# Patient Record
Sex: Female | Born: 1980 | Race: Black or African American | Hispanic: No | Marital: Single | Smoking: Never smoker
Health system: Southern US, Community
[De-identification: ages and names within clinical notes are randomized; demographics above are authoritative.]

## PROBLEM LIST (undated history)

## (undated) ENCOUNTER — Inpatient Hospital Stay (HOSPITAL_COMMUNITY): Payer: Self-pay

---
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
## 2000-07-04 ENCOUNTER — Encounter: Payer: Self-pay | Admitting: Emergency Medicine

## 2000-07-04 ENCOUNTER — Emergency Department (HOSPITAL_COMMUNITY): Admission: EM | Admit: 2000-07-04 | Discharge: 2000-07-04 | Payer: Self-pay | Admitting: Emergency Medicine

## 2004-02-11 ENCOUNTER — Emergency Department (HOSPITAL_COMMUNITY): Admission: EM | Admit: 2004-02-11 | Discharge: 2004-02-11 | Payer: Self-pay | Admitting: Emergency Medicine

## 2004-02-13 ENCOUNTER — Emergency Department (HOSPITAL_COMMUNITY): Admission: EM | Admit: 2004-02-13 | Discharge: 2004-02-13 | Payer: Self-pay | Admitting: Emergency Medicine

## 2004-03-10 ENCOUNTER — Other Ambulatory Visit: Admission: RE | Admit: 2004-03-10 | Discharge: 2004-03-10 | Payer: Self-pay | Admitting: Obstetrics and Gynecology

## 2006-07-26 ENCOUNTER — Other Ambulatory Visit: Admission: RE | Admit: 2006-07-26 | Discharge: 2006-07-26 | Payer: Self-pay | Admitting: Obstetrics and Gynecology

## 2007-02-18 ENCOUNTER — Inpatient Hospital Stay (HOSPITAL_COMMUNITY): Admission: AD | Admit: 2007-02-18 | Discharge: 2007-02-18 | Payer: Self-pay | Admitting: Obstetrics

## 2007-02-21 ENCOUNTER — Ambulatory Visit (HOSPITAL_COMMUNITY): Admission: RE | Admit: 2007-02-21 | Discharge: 2007-02-21 | Payer: Self-pay | Admitting: Obstetrics

## 2007-07-14 ENCOUNTER — Inpatient Hospital Stay (HOSPITAL_COMMUNITY): Admission: AD | Admit: 2007-07-14 | Discharge: 2007-07-14 | Payer: Self-pay | Admitting: Obstetrics & Gynecology

## 2007-07-19 ENCOUNTER — Inpatient Hospital Stay (HOSPITAL_COMMUNITY): Admission: AD | Admit: 2007-07-19 | Discharge: 2007-07-23 | Payer: Self-pay | Admitting: Obstetrics

## 2007-07-20 ENCOUNTER — Encounter: Payer: Self-pay | Admitting: Obstetrics

## 2007-09-07 ENCOUNTER — Emergency Department (HOSPITAL_COMMUNITY): Admission: EM | Admit: 2007-09-07 | Discharge: 2007-09-07 | Payer: Self-pay | Admitting: Emergency Medicine

## 2008-11-17 ENCOUNTER — Ambulatory Visit: Payer: Self-pay | Admitting: Family Medicine

## 2008-11-17 DIAGNOSIS — M25569 Pain in unspecified knee: Secondary | ICD-10-CM

## 2008-11-20 ENCOUNTER — Encounter (INDEPENDENT_AMBULATORY_CARE_PROVIDER_SITE_OTHER): Payer: Self-pay | Admitting: Family Medicine

## 2008-11-20 ENCOUNTER — Ambulatory Visit: Payer: Self-pay | Admitting: Family Medicine

## 2008-11-20 LAB — CONVERTED CEMR LAB
Cholesterol: 164 mg/dL (ref 0–200)
HDL: 46 mg/dL (ref >39)
LDL Cholesterol: 103 mg/dL — ABNORMAL HIGH (ref 0–99)
Total CHOL/HDL Ratio: 3.6
Triglycerides: 77 mg/dL (ref <150)
VLDL: 15 mg/dL (ref 0–40)

## 2008-11-21 ENCOUNTER — Encounter (INDEPENDENT_AMBULATORY_CARE_PROVIDER_SITE_OTHER): Payer: Self-pay | Admitting: Family Medicine

## 2010-03-12 ENCOUNTER — Inpatient Hospital Stay (HOSPITAL_COMMUNITY): Admission: AD | Admit: 2010-03-12 | Discharge: 2010-03-12 | Payer: Self-pay | Admitting: Obstetrics and Gynecology

## 2010-04-22 ENCOUNTER — Inpatient Hospital Stay (HOSPITAL_COMMUNITY): Admission: AD | Admit: 2010-04-22 | Discharge: 2010-04-22 | Payer: Self-pay | Admitting: Obstetrics

## 2010-04-22 ENCOUNTER — Ambulatory Visit: Payer: Self-pay | Admitting: Advanced Practice Midwife

## 2010-05-18 ENCOUNTER — Ambulatory Visit (HOSPITAL_COMMUNITY): Admission: RE | Admit: 2010-05-18 | Discharge: 2010-05-18 | Payer: Self-pay | Admitting: Obstetrics

## 2010-10-15 ENCOUNTER — Inpatient Hospital Stay (HOSPITAL_COMMUNITY): Admission: RE | Admit: 2010-10-15 | Discharge: 2010-10-18 | Payer: Self-pay | Admitting: Obstetrics

## 2010-10-15 ENCOUNTER — Encounter: Payer: Self-pay | Admitting: Obstetrics

## 2011-01-25 ENCOUNTER — Encounter: Payer: Self-pay | Admitting: *Deleted

## 2011-03-02 LAB — CBC
HCT: 21.3 % — ABNORMAL LOW (ref 36.0–46.0)
Hemoglobin: 6.9 g/dL — CL (ref 12.0–15.0)
MCHC: 33.2 g/dL (ref 30.0–36.0)
MCV: 80.6 fL (ref 78.0–100.0)
Platelets: 229 10*3/uL (ref 150–400)
RDW: 14.6 % (ref 11.5–15.5)
WBC: 10.2 10*3/uL (ref 4.0–10.5)

## 2011-03-02 LAB — SURGICAL PCR SCREEN: Staphylococcus aureus: POSITIVE — AB

## 2011-03-02 LAB — RPR: RPR Ser Ql: NONREACTIVE

## 2011-03-14 LAB — URINALYSIS, ROUTINE W REFLEX MICROSCOPIC
Glucose, UA: NEGATIVE mg/dL
Hgb urine dipstick: NEGATIVE
Specific Gravity, Urine: 1.02 (ref 1.005–1.030)
pH: 7 (ref 5.0–8.0)

## 2011-03-14 LAB — POCT PREGNANCY, URINE: Preg Test, Ur: POSITIVE

## 2011-03-14 LAB — GC/CHLAMYDIA PROBE AMP, GENITAL
Chlamydia, DNA Probe: NEGATIVE
GC Probe Amp, Genital: NEGATIVE

## 2011-03-14 LAB — WET PREP, GENITAL
Trich, Wet Prep: NONE SEEN
Yeast Wet Prep HPF POC: NONE SEEN

## 2011-05-03 NOTE — Discharge Summary (Signed)
Dorothy Lopez, MARCEL                ACCOUNT NO.:  [id number]   MEDICAL RECORD NO.:  [id number]          PATIENT TYPE:  INP   LOCATION:  9125                          FACILITY:  [facility]   PHYSICIAN:  Charles A. Clearance Coots, M.D.DATE OF BIRTH:  1981/01/13   DATE OF ADMISSION:  07/19/2007  DATE OF DISCHARGE:  07/23/2007                               DISCHARGE SUMMARY   ADMITTING DIAGNOSES:  1. A 40-week gestation.  2. Previous cesarean section.  3. Active labor.  4. Desires trial of labor.   DISCHARGE DIAGNOSES:  1. A 40-week gestation, status post repeat low transverse cesarean      section and lysis of adhesions for arrest of first stage of labor,      arrest of descent, meconium-stained fluid, occipitoposterior,      viable female infant delivered on July 20, 2007, at 1620 hours,      with Apgar's of 9 and 9 at one and five minutes, weight 3620 g,      length 52 cm.  Mother and infant discharged home in good condition.  2. Previous cesarean section.  3. Active labor.  4. Desires trial of labor.   REASON FOR ADMISSION:  A 30 year old, black female, G2, P60, estimated  date of confinement of July 19, 2007, presents with uterine  contractions.  Prenatal care was uncomplicated.  Group B Streptococcus  positive.  The patient had a previous cesarean section for nonreassuring  fetal heart rate.  She desired trial of labor.   PAST SURGICAL HISTORY:  Cesarean section.   PAST MEDICAL HISTORY:  None.   MEDICATIONS:  Prenatal vitamins.   ALLERGIES:  No known drug allergies.   SOCIAL HISTORY:  Single.  Negative for tobacco, alcohol or recreational  drug use.   PHYSICAL EXAMINATION:  GENERAL:  A well-nourished, well-developed female  in no acute distress.  VITAL SIGNS:  Afebrile, vital signs are stable.  LUNGS:  Clear to auscultation bilaterally.  HEART:  Regular rate and rhythm.  ABDOMEN:  Gravid, nontender.  Cervix 5-6 cm dilated, 80% effaced and  vertex at -2 station.  Left  occipitoposterior position.   LABORATORY DATA AND X-RAY FINDINGS:  Admitting laboratory values with  hemoglobin 10.7, hematocrit 32, white blood cell count 7200, platelets  262,000.  RPR was nonreactive.   HOSPITAL COURSE:  The patient was admitted and progressed to no more  than 6 cm dilatation and remained the same for greater than 4 hours  active labor with adequate contractile forces measured by the  intrauterine pressure catheter.  A decision was made to proceed with  cesarean section delivery for arrest of descent.  The patient underwent  a repeat low transverse cesarean section and lysis of adhesions.  There  were no intraoperative complications.  Postoperative course was  significant for a postop anemia which resulted from underestimation of  the blood loss during surgery, but the patient had a baseline borderline  anemia of hemoglobin of 10 on admission.  There were no hemodynamic  changes postoperatively and the patient did not require transfusion of  blood products.  She did well postoperatively  and was discharged home on  postop day #3, in good condition.   DISCHARGE LABORATORY DATA AND X-RAY FINDINGS:  Hemoglobin 7.7,  hematocrit 23, white blood cell count 11,000, platelets 162,000.   DISCHARGE MEDICATIONS:  Tylox and ibuprofen was prescribed for pain.  Continue prenatal vitamins.   SPECIAL INSTRUCTIONS:  Routine written instructions were given for  discharge after cesarean section.   FOLLOW UP:  The patient is to call the office for a followup appointment  in 2 weeks.      Charles A. Clearance Coots, M.D.  Electronically Signed     CAH/MEDQ  D:  07/23/2007  T:  07/23/2007  Job:  [id number]

## 2011-05-03 NOTE — Op Note (Signed)
Dorothy Lopez, Dorothy Lopez                ACCOUNT NO.:  [id number]   MEDICAL RECORD NO.:  [id number]          PATIENT TYPE:  INP   LOCATION:  9125                          FACILITY:  [facility]   PHYSICIAN:  Charles A. Clearance Coots, M.D.DATE OF BIRTH:  07-Apr-1981   DATE OF PROCEDURE:  07/20/2007  DATE OF DISCHARGE:                               OPERATIVE REPORT   PREOPERATIVE DIAGNOSES:  1. Arrest of first stage of labor.  2. Arrest of descent.  3. Previous cesarean section.  4. Meconium.  5. Occiput posterior.   POSTOPERATIVE DIAGNOSES:  1. Arrest of first stage of labor.  2. Arrest of descent.  3. Previous cesarean section.  4. Meconium.  5. Occiput posterior.  6. Multiple pelvic adhesions.   PROCEDURES:  1. Repeat low transverse cesarean section.  2. Lysis of adhesions   SURGEON:  Charles A. Clearance Coots, M.D.   ASSISTANT:  Roseanna Rainbow, M.D.   ANESTHESIA:  Epidural.   ESTIMATED BLOOD LOSS:  800 mL.   IV FLUIDS:  2500 mL.   URINE OUTPUT:  250 mL, clear.   COMPLICATIONS:  None.   Foley to gravity.   FINDINGS:  Viable female at 17:20, Apgars of 9 at one minute and 9 at  five minutes, weight of 7 pounds 15 ounces.  Multiple serosal adhesions  between the uterus and pelvic sidewall.   OPERATION:  The patient was brought to the operating room, and after  satisfactory redosing of the epidural, the abdomen was prepped and  draped in the usual sterile fashion.  A Pfannenstiel skin incision was  made through the previous scar, with the scalpel down to the fascia.  Fascia was nicked in the midline, and the fascial incision extended to  left and to the right with curved Mayo scissors.  The superior and  inferior fascial edges were taken off of the rectus muscles with both  blunt sharp dissection.  The rectus muscle was divided in the midline  sharply.  Upon entering the peritoneum, multiple adhesions were noted  between the uterine serosa and the parietal peritoneum and pelvic  sidewall anteriorly and laterally.  The adhesions were lysed, and the  lower uterine segment was identified, and the bladder peritoneum was  grasped and incised with Metzenbaum scissors away from the lower uterine  segment sharply.  The bladder blade was then positioned in front of the  urinary bladder, placing it well out of the operative field.  The uterus  was entered with short strokes of the scalpel.  Light meconium-stained  fluid was expelled.  The uterine incision was extended to the left and  to the right with digital traction.  Vertex was noted to be left occiput  posterior in position.  The occiput was rotated to anterior position  into the incision and was then very difficult to deliver with the aid  fundal pressure, and vacuum extractor was then placed on the occiput and  flexed into the incision and delivered without complications with one  pull.  The infant's mouth and nose were suctioned with a suction bulb,  and the and delivery was  completed with the aid of fundal pressure from  the assistant.  The umbilical cord was doubly clamped and cut, and the  infant was handed off to the nursery staff.  Cord blood and cord pH were  obtained, and the placenta was spontaneously expelled from the uterine  cavity intact.  The endometrial surface was thoroughly debrided with a  dry lap sponge.  The edges of the uterine incision were grasped with  ring forceps.  The uterus was closed with a continuous interlocking  suture of 0 Monocryl.  Hemostasis was obtained with a few interrupted  sutures of 0 Monocryl in the areas of serosal bleeding.  There was no  active bleeding from the closure of the uterus.  The pelvic cavity was  thoroughly irrigated with warm saline solution.  All clots were removed.  No active bleeding was noted.  The peritoneum was grasped with ring  forceps and closed with a continuous suture of 2-0 Vicryl.  The fascia  was closed with a continuous suture of 0 Vicryl.   Subcutaneous tissue  was thoroughly irrigated with warm saline solution.  All areas of  subcutaneous bleeding were coagulated with the Bovie.  The skin was then  approximated with surgical stainless steel staples.  A sterile bandage  was applied to the incision closure.  The surgical technician indicated  that all needle, sponge, and instrument counts were correct x2.  The  patient tolerated the procedure well and was transported to the recovery  room in satisfactory condition.      Charles A. Clearance Coots, M.D.  Electronically Signed     CAH/MEDQ  D:  07/20/2007  T:  07/21/2007  Job:  [id number]

## 2011-05-06 NOTE — Op Note (Signed)
[facility]  Patient:    Dorothy Lopez                        MRN: [id number] Proc. Date: 12/26/98 Adm. Date:  [id number] Attending:  Antionette Char CC:         Lenoard Aden, M.D.                           Operative Report  PREOPERATIVE DIAGNOSIS:  ____________ maternal fever.  POSTOPERATIVE DIAGNOSIS:  ____________ maternal fever.  OPERATION:  Primary low segment transverse cesarean section.  SURGEON:  Lenoard Aden, M.D.  ANESTHESIA:  Epidural by Gretta Cool., M.D.  ESTIMATED BLOOD LOSS:  1000 cc.  COMPLICATIONS:  None.  DRAINS:  Foley catheter.  COUNTS:  Correct.  FINDINGS:  Full term living female handed to pediatricians in attendance. Apgars 8 and 9.  Placenta was removed manually, intact, sent to pathology.  Patient to recovery in good condition.  All counts correct.  DESCRIPTION OF PROCEDURE:  Being apprised of risks of anesthesia, infection, bleeding, injury to intra-abdominal organs and need for repair, the patient was  brought to the operating room where she was dosed on her epidural surgical anesthesia without complications, prepped and draped, Foley catheter placed. At this time a Pfannenstiel skin incision was made with a scalpel and carried down to the fascia which was nicked in the midline, extended transversely using Mayo scissors. The rectus muscle was dissected sharply in the midline.  The parietal  peritoneum entered sharply.  Bladder blade placed.  The visceral peritoneum was  scored in a smiling fashion.  Parahysterotomy incision made for delivery of a full term living female fetus from left occiput transverse position and handed to pediatricians in attendance, receiving Apgars of 8 and 9.  Uterus exteriorized.  Placenta delivered manually intact, three-vessel cord noted, sent to pathology. At this time bilateral normal tubes and ovaries were noted.  Uterus ____________ bilaterally closed  using 0 Monocryl in continuous running fashion.  Good hemostasis was achieved.  Bladder flap was inspected.  Rectus muscle was reapproximated in the midline, fascia closed using 0 Vicryl in a continuous running fashion.  Skin closed using staples.  The patient tolerated the procedure well and went to recovery in good condition. DD:  12/28/99 TD:  12/28/99 Job: [id number]

## 2011-05-06 NOTE — Discharge Summary (Signed)
[facility]  Patient:    Dorothy Lopez                        MRN: [id number] Adm. Date:  [id number] Disc. Date: [id number] Attending:  Antionette Char                           Discharge Summary  ADMISSION HISTORY:            The patient is an 30 year old, gravida 1, para 0, at term who is admitted in early labor.  HOSPITAL COURSE:              The patient had arrest of active phase of labor and required a low transverse cesarean delivery.  Postoperatively the patient had some mild hypertension that resolved.  The patient had good return of bowel and bladder function and was felt ready for discharge on December 30, 1999.  FOLLOWUP:                     The patient was instructed to have follow-up visits at [facility] in approximately six weeks with Dr. Reed Breech. DD:  01/14/00 TD:  01/15/00 Job: [id number]

## 2011-10-03 LAB — CBC
HCT: 23 — ABNORMAL LOW
HCT: 32 — ABNORMAL LOW
MCHC: 33.7
MCV: 83.8
Platelets: 162
Platelets: 262
RDW: 13.9
RDW: 13.8
WBC: 11.1 — ABNORMAL HIGH

## 2011-11-23 ENCOUNTER — Emergency Department (HOSPITAL_COMMUNITY)
Admission: EM | Admit: 2011-11-23 | Discharge: 2011-11-23 | Disposition: A | Discharge status: Home / Self Care | Payer: Medicaid Other | Attending: Emergency Medicine | Admitting: Emergency Medicine

## 2011-11-23 DIAGNOSIS — J3489 Other specified disorders of nose and nasal sinuses: Secondary | ICD-10-CM | POA: Insufficient documentation

## 2011-11-23 DIAGNOSIS — H9202 Otalgia, left ear: Secondary | ICD-10-CM

## 2011-11-23 DIAGNOSIS — H9209 Otalgia, unspecified ear: Secondary | ICD-10-CM | POA: Insufficient documentation

## 2011-11-23 MED ORDER — NAPROXEN 500 MG PO TABS: 500.0000 mg | ORAL_TABLET | Freq: Two times a day (BID) | ORAL | Status: AC

## 2011-11-23 NOTE — ED Provider Notes (Signed)
History     CSN: [id number] Arrival date & time: 11/23/2011  1:38 PM   First MD Initiated Contact with Patient 11/23/11 1639      Chief Complaint  Patient presents with  . Otalgia    (Consider location/radiation/quality/duration/timing/severity/associated sxs/prior treatment) The history is provided by the patient.   patient is a 30 year old female with complaint of left ear pain for 3 weeks has gotten worse in the past few days associated with an upper respiratory infection that developed in the past few days. The pain is dull and at times sharp at worst is 10 out of 10 currently it is 5/10. No headache. No jaw. No tooth pain. No fever. She does have the nasal congestion, the pain waxes and wanes but for the most part is usually there.  History reviewed. No pertinent past medical history.  Past Surgical History  Procedure Date  . Cesarean section     No family history on file.  History  Substance Use Topics  . Smoking status: Never Smoker   . Smokeless tobacco: Not on file  . Alcohol Use: Yes    OB History    Grav Para Term Preterm Abortions TAB SAB Ect Mult Living                  Review of Systems  Constitutional: Negative for fever and chills.  HENT: Positive for ear pain and congestion. Negative for hearing loss, neck pain, tinnitus and ear discharge.   Eyes: Negative for photophobia, pain and visual disturbance.  Respiratory: Negative for cough and shortness of breath.   Cardiovascular: Negative for chest pain.  Gastrointestinal: Negative for nausea, vomiting and abdominal pain.  Genitourinary: Negative for dysuria and hematuria.  Musculoskeletal: Negative for back pain.  Skin: Negative for rash.  Neurological: Negative for headaches.  Hematological: Negative for adenopathy.    Allergies  Review of patient's allergies indicates no known allergies.  Home Medications  No current outpatient prescriptions on file.  BP 111/62  Pulse 67  Temp(Src) 98.1 F  (36.7 C) (Oral)  Resp 16  Ht 5' (1.524 m)  Wt 148 lb (67.132 kg)  BMI 28.90 kg/m2  SpO2 98%  LMP 11/16/2011  Physical Exam  Nursing note and vitals reviewed. Constitutional: She is oriented to person, place, and time. She appears well-developed and well-nourished. No distress.  HENT:  Head: Normocephalic and atraumatic.  Right Ear: External ear normal.  Left Ear: External ear normal.  Mouth/Throat: Oropharynx is clear and moist.  Eyes: Conjunctivae and EOM are normal. Pupils are equal, round, and reactive to light.  Neck: Normal range of motion. Neck supple.  Cardiovascular: Normal rate, regular rhythm and normal heart sounds.   No murmur heard. Pulmonary/Chest: Effort normal and breath sounds normal.  Abdominal: Soft. Bowel sounds are normal.  Musculoskeletal: Normal range of motion. She exhibits no edema.  Neurological: She is alert and oriented to person, place, and time. No cranial nerve deficit. She exhibits normal muscle tone. Coordination normal.  Skin: Skin is warm.    ED Course  Procedures (including critical care time)  Labs Reviewed - No data to display No results found.   1. Left ear pain       MDM   Patient with history of left ear pain for 3 weeks has gotten worse in the past few days, beginning worse is associated with the onset of an upper respiratory infection. Workup in the emergency department negative both ears looked perfectly normal maybe a pressure problem  we'll treat with anti-inflammatories and recommend over-the-counter cold meds.         Dorothy Jakes, MD 11/23/11 [phone]

## 2011-11-23 NOTE — ED Notes (Signed)
Pt presents with 3 week h/o L ear pain.  Pt reports pain has been intermittent, but recently has been constant.  Pt denies any drainage.  +runny  nose

## 2011-11-28 ENCOUNTER — Encounter: Payer: Self-pay | Admitting: Family Medicine

## 2011-11-28 ENCOUNTER — Emergency Department (HOSPITAL_COMMUNITY): Admission: EM | Admit: 2011-11-28 | Discharge: 2011-11-28 | Payer: Medicaid Other | Source: Home / Self Care

## 2011-11-28 ENCOUNTER — Ambulatory Visit (INDEPENDENT_AMBULATORY_CARE_PROVIDER_SITE_OTHER): Payer: Medicaid Other | Admitting: Family Medicine

## 2011-11-28 VITALS — BP 109/73 | HR 90 | Temp 100.0°F | Ht 60.0 in | Wt 155.0 lb

## 2011-11-28 DIAGNOSIS — Z5321 Procedure and treatment not carried out due to patient leaving prior to being seen by health care provider: Secondary | ICD-10-CM

## 2011-11-28 DIAGNOSIS — R6889 Other general symptoms and signs: Secondary | ICD-10-CM

## 2011-11-28 NOTE — Progress Notes (Signed)
  Subjective:    Patient ID: Dorothy Lopez, female    DOB: [birth year], 30 y.o.   MRN: [id number]  HPI flulike symptoms: Patient reports positive headache, backache, weakness all over, positive cough, positive sore throat, and fever but 102 that came on  all of a sudden in the middle the night. No nausea. No vomiting. No diarrhea. Mild decrease in appetite. Able to drink fluids without problem. No known sick contacts. 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   Review of Systems As per above    Objective:   Physical Exam  Constitutional: She is oriented to person, place, and time. She appears well-developed and well-nourished.  HENT:  Head: Normocephalic and atraumatic.  Eyes: Pupils are equal, round, and reactive to light.  Neck: Normal range of motion. Neck supple.  Cardiovascular: Normal rate, regular rhythm and normal heart sounds.   No murmur heard. Pulmonary/Chest: Effort normal. No respiratory distress. She has no wheezes.  Abdominal: Soft. She exhibits no distension.  Musculoskeletal: Normal range of motion. She exhibits no edema.  Neurological: She is alert and oriented to person, place, and time.  Skin: No rash noted.  Psychiatric: She has a normal mood and affect. Her behavior is normal.          Assessment & Plan:

## 2011-12-02 NOTE — ED Provider Notes (Signed)
Medical screening examination/treatment/procedure(s) were performed by non-physician practitioner and as supervising physician I was immediately available for consultation/collaboration.  Luiz Blare MD   Luiz Blare, MD 12/02/11 [phone]

## 2011-12-02 NOTE — ED Provider Notes (Signed)
History     CSN: [id number] Arrival date & time: No admission date for patient encounter.   None     No chief complaint on file.   (Consider location/radiation/quality/duration/timing/severity/associated sxs/prior treatment) HPI  No past medical history on file.  Past Surgical History  Procedure Date  . Cesarean section     No family history on file.  History  Substance Use Topics  . Smoking status: Never Smoker   . Smokeless tobacco: Not on file  . Alcohol Use: Yes    OB History    Grav Para Term Preterm Abortions TAB SAB Ect Mult Living                  Review of Systems  Allergies  Review of patient's allergies indicates no known allergies.  Home Medications   Current Outpatient Rx  Name Route Sig Dispense Refill  . NAPROXEN 500 MG PO TABS Oral Take 1 tablet (500 mg total) by mouth 2 (two) times daily. 20 tablet 0    LMP 11/16/2011  Physical Exam  ED Course  Procedures (including critical care time)  Labs Reviewed - No data to display No results found.   1. Patient left without being seen       MDM  Pt left prior to triage.         Melody Comas, [place] 12/02/11 1012

## 2011-12-07 NOTE — Assessment & Plan Note (Signed)
Symptoms most likely due to flu or flulike illness. Symptomatic treatment only at this time. patient to return if new or worsening symptoms.

## 2011-12-27 ENCOUNTER — Ambulatory Visit: Payer: Medicaid Other | Admitting: Family Medicine

## 2012-02-28 ENCOUNTER — Ambulatory Visit (INDEPENDENT_AMBULATORY_CARE_PROVIDER_SITE_OTHER): Payer: Medicaid Other | Admitting: Family Medicine

## 2012-02-28 ENCOUNTER — Encounter: Payer: Self-pay | Admitting: Family Medicine

## 2012-02-28 VITALS — BP 106/69 | HR 82 | Ht 60.0 in | Wt 146.5 lb

## 2012-02-28 DIAGNOSIS — Z309 Encounter for contraceptive management, unspecified: Secondary | ICD-10-CM

## 2012-02-28 DIAGNOSIS — IMO0001 Reserved for inherently not codable concepts without codable children: Secondary | ICD-10-CM

## 2012-02-28 MED ORDER — MEDROXYPROGESTERONE ACETATE 150 MG/ML IM SUSP
150.0000 mg | Freq: Once | INTRAMUSCULAR | Status: AC
  Administered 2012-02-28: 150 mg via INTRAMUSCULAR

## 2012-02-28 NOTE — Progress Notes (Signed)
Addended by: Jennette Bill on: 02/28/2012 04:47 PM   Modules accepted: Orders

## 2012-02-28 NOTE — Assessment & Plan Note (Signed)
U. pregnant negative. Dental shot given in clinic today. Patient should follow up with her PCP in the next 6 weeks. Patient agreeable plan.

## 2012-02-28 NOTE — Progress Notes (Signed)
  Subjective:    Patient ID: Dorothy Lopez, female    DOB: 1981/11/24, 30 y.o.   MRN: [id number]  HPI Patient presents today for contraception. Patient desires that were sharp. Patient's issues on this before in the past prior to her 37-month-old son being born. Patient tolerated this past and this is her peripheral preferable mode of contraception. No history of blood clots or pulmonary emboli. No history of adverse events with Depo-Provera in the past. Last menstrual cycle February 27.   Review of Systems See HPI, otherwise ROS negative.     Objective:   Physical Exam Gen: up in chair, NAD HEENT: NCAT, EOMI CV: RRR, no murmurs auscultated PULM: CTAB, no wheezes, rales, rhoncii ABD: S/NT/+ bowel sounds       Assessment & Plan:

## 2012-05-15 ENCOUNTER — Ambulatory Visit (INDEPENDENT_AMBULATORY_CARE_PROVIDER_SITE_OTHER): Payer: Medicaid Other | Admitting: *Deleted

## 2012-05-15 DIAGNOSIS — Z309 Encounter for contraceptive management, unspecified: Secondary | ICD-10-CM

## 2012-05-15 LAB — POCT URINE PREGNANCY: Preg Test, Ur: NEGATIVE

## 2012-05-15 MED ORDER — MEDROXYPROGESTERONE ACETATE 150 MG/ML IM SUSP
150.0000 mg | Freq: Once | INTRAMUSCULAR | Status: AC
  Administered 2012-05-15: 150 mg via INTRAMUSCULAR

## 2012-05-15 NOTE — Progress Notes (Signed)
Patient started on depo in March . She did not return for 2nd  urine preg test. U preg obtained today  and is negative.

## 2012-11-02 ENCOUNTER — Ambulatory Visit: Payer: Medicaid Other | Admitting: Family Medicine

## 2013-09-10 ENCOUNTER — Other Ambulatory Visit: Payer: Self-pay

## 2013-10-04 ENCOUNTER — Ambulatory Visit (INDEPENDENT_AMBULATORY_CARE_PROVIDER_SITE_OTHER): Payer: Medicaid Other | Admitting: *Deleted

## 2013-10-04 VITALS — BP 104/69 | HR 76 | Temp 98.2°F | Ht 60.0 in | Wt 149.6 lb

## 2013-10-04 DIAGNOSIS — N912 Amenorrhea, unspecified: Secondary | ICD-10-CM

## 2013-10-04 MED ORDER — PRENATE MINI 29-0.6-0.4-350 MG PO CAPS: 1.0000 | ORAL_CAPSULE | Freq: Every day | ORAL | Status: DC

## 2013-10-04 MED ORDER — BUTOCONAZOLE NITRATE (1 DOSE) 2 % VA CREA: 1.0000 | TOPICAL_CREAM | Freq: Once | VAGINAL | Status: DC

## 2013-10-04 NOTE — Progress Notes (Signed)
    Dorothy Lopez is a 32 y.o. female who presents for evaluation of amenorrhea. She believes she could be pregnant. Pregnancy is desired. Sexual Activity: has sex with males. Current symptoms also include none. Last period was normal.   Patient's last menstrual period was 07/21/2013. The following portions of the patient's history were reviewed and updated as appropriate: allergies, current medications, past family history, past medical history, past social history, past surgical history and problem list.     BP 104/69  Pulse 76  Temp(Src) 98.2 F (36.8 C) (Oral)  Ht 5' (1.524 m)  Wt 149 lb 9.6 oz (67.858 kg)  BMI 29.22 kg/m2  LMP 07/21/2013  Lab ReviewUrine HCG: positive

## 2013-10-07 ENCOUNTER — Encounter: Payer: Self-pay | Admitting: Obstetrics

## 2013-10-10 ENCOUNTER — Encounter (HOSPITAL_COMMUNITY): Payer: Self-pay | Admitting: Emergency Medicine

## 2013-10-10 ENCOUNTER — Emergency Department (HOSPITAL_COMMUNITY): Payer: Medicaid Other

## 2013-10-10 ENCOUNTER — Emergency Department (HOSPITAL_COMMUNITY)
Admission: EM | Admit: 2013-10-10 | Discharge: 2013-10-10 | Disposition: A | Discharge status: Home / Self Care | Payer: Medicaid Other | Attending: Emergency Medicine | Admitting: Emergency Medicine

## 2013-10-10 DIAGNOSIS — Z3490 Encounter for supervision of normal pregnancy, unspecified, unspecified trimester: Secondary | ICD-10-CM

## 2013-10-10 DIAGNOSIS — R296 Repeated falls: Secondary | ICD-10-CM | POA: Insufficient documentation

## 2013-10-10 DIAGNOSIS — Y9289 Other specified places as the place of occurrence of the external cause: Secondary | ICD-10-CM | POA: Insufficient documentation

## 2013-10-10 DIAGNOSIS — O9989 Other specified diseases and conditions complicating pregnancy, childbirth and the puerperium: Secondary | ICD-10-CM | POA: Insufficient documentation

## 2013-10-10 DIAGNOSIS — S93401A Sprain of unspecified ligament of right ankle, initial encounter: Secondary | ICD-10-CM

## 2013-10-10 DIAGNOSIS — S93409A Sprain of unspecified ligament of unspecified ankle, initial encounter: Secondary | ICD-10-CM | POA: Insufficient documentation

## 2013-10-10 DIAGNOSIS — Z79899 Other long term (current) drug therapy: Secondary | ICD-10-CM | POA: Insufficient documentation

## 2013-10-10 DIAGNOSIS — Y9389 Activity, other specified: Secondary | ICD-10-CM | POA: Insufficient documentation

## 2013-10-10 NOTE — ED Provider Notes (Signed)
Medical screening examination/treatment/procedure(s) were performed by non-physician practitioner and as supervising physician I was immediately available for consultation/collaboration.  EKG Interpretation   None         Layla Maw Jaymie Mckiddy, DO 10/10/13 2013

## 2013-10-10 NOTE — ED Notes (Signed)
Pt reports falling yesterday and now having right foot pain, no obv injury or swelling noted. Ambulatory at triage.

## 2013-10-10 NOTE — ED Notes (Signed)
Patient transported to X-ray 

## 2013-10-10 NOTE — ED Provider Notes (Signed)
CSN: [id number]     Arrival date & time 10/10/13  8469 History  This chart was scribed for non-physician practitioner Raymon Mutton, PA-C working with Layla Maw Ward, DO by Valera Castle, ED scribe. This patient was seen in room TR06C/TR06C and the patient's care was started at 10:17 AM.    Chief Complaint  Patient presents with  . Foot Pain   The history is provided by the patient. No language interpreter was used.   HPI Comments: Dorothy Lopez is a 32 y.o. female who presents to the Emergency Department complaining of sudden, mild, constant, right foot pain, without swelling or obvious injury, onset yesterday evening after falling. She reports twisting her ankle while wearing boots, as she was stepping up on a curb and slipped on some pine needles. She reports she endured the pain yesterday, hoping the pain would subside, but reports she woke up this morning with increased pain and was unable to put pressure on her foot. She states at rest her foot is fine, but movement exacerbates the pain. She reports being unable to bend her foot, or stand on her tiptoes. She denies the pain radiating anywhere. She denies taking any medication for the pain, or applying ice to the area, but she does report having elevated her foot. She denies any previous injury, recent falls. She denies loss of sensation, numbness, tingling, head trauma, LOC, and any other associated symptoms. She reports being pregnant, and denies any other medical history.   PCP -Beverely Low, MD Prenatal care - [facility]    History reviewed. No pertinent past medical history. Past Surgical History  Procedure Laterality Date  . Cesarean section      x 3   Family History  Problem Relation Age of Onset  . Diabetes Neg Hx   . Hypertension Neg Hx   . Cancer Neg Hx   . Heart disease Neg Hx   . Kidney disease Brother     deceased at age 10   History  Substance Use Topics  . Smoking status: Never Smoker   . Smokeless  tobacco: Never Used  . Alcohol Use: No   OB History   Grav Para Term Preterm Abortions TAB SAB Ect Mult Living   2 1 1             Review of Systems  Musculoskeletal: Positive for arthralgias (right foot).  Neurological: Negative for syncope, weakness and numbness.       Pt denies tingling, loss of sensation.  All other systems reviewed and are negative.    Allergies  Review of patient's allergies indicates no known allergies.  Home Medications   Current Outpatient Rx  Name  Route  Sig  Dispense  Refill  . Prenat w/o A-FeCbn-Meth-FA-DHA (PRENATE MINI) 29-0.6-0.4-350 MG CAPS   Oral   Take 1 capsule by mouth daily.   30 capsule   11    Triage Vitals: BP 129/63  Pulse 86  Temp(Src) 98.3 F (36.8 C) (Oral)  Resp 18  Ht 5' (1.524 m)  Wt 149 lb (67.586 kg)  BMI 29.1 kg/m2  SpO2 100%  LMP 07/21/2013  Physical Exam  Nursing note and vitals reviewed. Constitutional: She is oriented to person, place, and time. She appears well-developed and well-nourished. No distress.  HENT:  Head: Normocephalic and atraumatic.  Neck: No tracheal deviation present.  Cardiovascular: Normal rate and regular rhythm.   Pulses:      Radial pulses are 2+ on the right side, and 2+  on the left side.       Dorsalis pedis pulses are 2+ on the right side, and 2+ on the left side.  Pulmonary/Chest: Effort normal and breath sounds normal. No respiratory distress.  Musculoskeletal: Normal range of motion.       Feet:  Negative swelling, erythema, inflammation, deformities, ecchymosis noted to the right ankle or right foot. Discomfort upon palpation to the lateral aspect of the dorsal foot. Full range of motion to lower extremity bilaterally-discomfort with inversion of the foot.  Neurological: She is alert and oriented to person, place, and time.  Sensation intact with differentiation to sharp and dull touch Strength 5+/5+ to lower extremities bilaterally with resistance applied, equal distribution  identified Strength intact to digits of the right foot  Skin: Skin is warm and dry.  Psychiatric: She has a normal mood and affect. Her behavior is normal.    ED Course  Procedures (including critical care time)  DIAGNOSTIC STUDIES: Oxygen Saturation is 100% on room air, normal by my interpretation.    COORDINATION OF CARE: 10:20 AM-Discussed treatment plan which includes discussing imaging results with pt at bedside and pt agreed to plan. Will refer pt to orthopedist. Advised pt while active to always wear her brace, to apply ice to the area, and to keep it elevated.   Labs Review Labs Reviewed - No data to display Imaging Review Dg Foot Complete Right  10/10/2013   CLINICAL DATA:  Twisted right foot, injury  EXAM: RIGHT FOOT COMPLETE - 3+ VIEW  COMPARISON:  None.  FINDINGS: 3 views of the right foot submitted. Minimal hallux valgus deformity. No acute fracture or subluxation. No radiopaque foreign body.  IMPRESSION: No acute fracture or subluxation. Minimal hallux valgus deformity.   Electronically Signed   By: Natasha Mead M.D.   On: 10/10/2013 09:50    EKG Interpretation   None      No orders of the defined types were placed in this encounter.     MDM   1. Ankle sprain, right, initial encounter   2. Pregnant    I personally performed the services described in this documentation, which was scribed in my presence. The recorded information has been reviewed and is accurate.  Patient presenting to emergency Department with ankle pain that started yesterday when patient tripped over the curb while she was in high heels. Reported that the ankle hurts, reports that she did not take anything, reported that she elevated the ankle without icing. Reported exacerbation of pain at approximately 1:30 AM this morning, reported she could not put pressure on it. Patient denied any trauma to the abdomen - denied vaginal bleeding and abdominal pain. Reported that she has an appointment with  [facility] on Thursday of next week.  Alert and oriented. Negative swelling, erythema, ecchymosis, deformities noted to the right foot and ankle. Full range of motion noted with discomfort with inversion. Strength intact to the foot and digits bilaterally - equal distribution identified. Pulses palpable and strong, DP 2+. Sensation intact. Negative neurological deficits identified. Plain film of right foot negative for acute abnormalities-negative fractures subluxation identified Patient stable, afebrile. Suspicion to be an ankle sprain, possible ligamental injury. Patient placed in ASO brace. Discussed with patient to rest, elevate, ice. Discussed with patient to avoid any strenuous activity. Referred patient to orthopedics. Discussed with patient to use only Tylenol. Discussed with patient to keep appointment for [facility] regarding. Discussed with patient to closely monitor symptoms and if symptoms are  to worsen or change report back to emergency department-strict return instructions given. Patient agreed to plan of care, understood, all questions answered   Raymon Mutton, PA-C 10/10/13 1718

## 2013-10-17 ENCOUNTER — Ambulatory Visit (INDEPENDENT_AMBULATORY_CARE_PROVIDER_SITE_OTHER): Payer: Medicaid Other | Admitting: Obstetrics

## 2013-10-17 ENCOUNTER — Encounter: Payer: Self-pay | Admitting: Obstetrics

## 2013-10-17 VITALS — BP 107/70 | Temp 98.1°F | Wt 153.4 lb

## 2013-10-17 DIAGNOSIS — Z3481 Encounter for supervision of other normal pregnancy, first trimester: Secondary | ICD-10-CM

## 2013-10-17 DIAGNOSIS — Z3480 Encounter for supervision of other normal pregnancy, unspecified trimester: Secondary | ICD-10-CM

## 2013-10-17 LAB — POCT URINALYSIS DIPSTICK: pH, UA: 7

## 2013-10-17 NOTE — Progress Notes (Signed)
HR - 93 New OB patient Subjective:    Dorothy Lopez is being seen today for her first obstetrical visit.  This is not a planned pregnancy. She is at [redacted]w[redacted]d gestation. Her obstetrical history is significant for 2 previous C/S's. Relationship with FOB: significant other, living together. Patient does not intend to breast feed. Pregnancy history fully reviewed.  Menstrual History: OB History   Grav Para Term Preterm Abortions TAB SAB Ect Mult Living   4 3 3       2       Menarche age: 89 Patient's last menstrual period was 07/21/2013.    The following portions of the patient's history were reviewed and updated as appropriate: allergies, current medications, past family history, past medical history, past social history, past surgical history and problem list.  Review of Systems Pertinent items are noted in HPI.    Objective:    General appearance: alert and no distress Abdomen: normal findings: soft, non-tender Pelvic: cervix normal in appearance, external genitalia normal, no adnexal masses or tenderness and no cervical motion tenderness.  Moderate amount of creamy vaginal discharge.  Uterus enlarged ~ 12 weeks, soft, NT. Extremities: extremities normal, atraumatic, no cyanosis or edema    Assessment:    Pregnancy at [redacted]w[redacted]d weeks    Plan:    Initial labs drawn. Prenatal vitamins.  Counseling provided regarding continued use of seat belts, cessation of alcohol consumption, smoking or use of illicit drugs; infection precautions i.e., influenza/TDAP immunizations, toxoplasmosis,CMV, parvovirus, listeria and varicella; workplace safety, exercise during pregnancy; routine dental care, safe medications, sexual activity, hot tubs, saunas, pools, travel, caffeine use, fish and methlymercury, potential toxins, hair treatments, varicose veins Weight gain recommendations reviewed: underweight/BMI< 18.5--> gain 28 - 40 lbs; normal weight/BMI 18.5 - 24.9--> gain 25 - 35 lbs; overweight/BMI 25 -  29.9--> gain 15 - 25 lbs; obese/BMI >30->gain  11 - 20 lbs Problem list reviewed and updated. AFP3 discussed: requested. Role of ultrasound in pregnancy discussed; fetal survey: requested. Amniocentesis discussed: not indicated. Follow up in 3 weeks. 50% of 20 min visit spent on counseling and coordination of care.

## 2013-10-17 NOTE — Addendum Note (Signed)
Addended by: George Hugh on: 10/17/2013 02:06 PM   Modules accepted: Orders

## 2013-10-17 NOTE — Addendum Note (Signed)
Addended by: George Hugh on: 10/17/2013 01:45 PM   Modules accepted: Orders

## 2013-10-18 LAB — OBSTETRIC PANEL
Antibody Screen: NEGATIVE
Basophils Relative: 0 % (ref 0–1)
Eosinophils Absolute: 0.2 10*3/uL (ref 0.0–0.7)
Hemoglobin: 10.6 g/dL — ABNORMAL LOW (ref 12.0–15.0)
Hepatitis B Surface Ag: NEGATIVE
MCH: 27.2 pg (ref 26.0–34.0)
MCHC: 32.8 g/dL (ref 30.0–36.0)
Monocytes Absolute: 0.5 10*3/uL (ref 0.1–1.0)
Monocytes Relative: 6 % (ref 3–12)
Neutrophils Relative %: 73 % (ref 43–77)
Rh Type: POSITIVE

## 2013-10-18 LAB — PAP IG W/ RFLX HPV ASCU

## 2013-10-18 LAB — GC/CHLAMYDIA PROBE AMP
CT Probe RNA: NEGATIVE
GC Probe RNA: NEGATIVE

## 2013-10-18 LAB — VITAMIN D 25 HYDROXY (VIT D DEFICIENCY, FRACTURES): Vit D, 25-Hydroxy: 19 ng/mL — ABNORMAL LOW (ref 30–89)

## 2013-10-18 LAB — HIV ANTIBODY (ROUTINE TESTING W REFLEX): HIV: NONREACTIVE

## 2013-10-18 LAB — WET PREP BY MOLECULAR PROBE: Gardnerella vaginalis: POSITIVE — AB

## 2013-10-21 ENCOUNTER — Other Ambulatory Visit: Payer: Self-pay | Admitting: *Deleted

## 2013-10-21 DIAGNOSIS — B9689 Other specified bacterial agents as the cause of diseases classified elsewhere: Secondary | ICD-10-CM

## 2013-10-21 DIAGNOSIS — N390 Urinary tract infection, site not specified: Secondary | ICD-10-CM

## 2013-10-21 LAB — HEMOGLOBINOPATHY EVALUATION
Hgb A2 Quant: 2.6 % (ref 2.2–3.2)
Hgb A: 97.4 % (ref 96.8–97.8)

## 2013-10-21 MED ORDER — METRONIDAZOLE 500 MG PO TABS: 500.0000 mg | ORAL_TABLET | Freq: Two times a day (BID) | ORAL | Status: DC

## 2013-10-21 MED ORDER — SULFAMETHOXAZOLE-TMP DS 800-160 MG PO TABS: 1.0000 | ORAL_TABLET | Freq: Two times a day (BID) | ORAL | Status: DC

## 2013-10-23 ENCOUNTER — Other Ambulatory Visit: Payer: Self-pay | Admitting: *Deleted

## 2013-10-23 MED ORDER — OB COMPLETE PETITE 35-5-1-200 MG PO CAPS: 1.0000 | ORAL_CAPSULE | Freq: Every day | ORAL | Status: DC

## 2013-11-07 ENCOUNTER — Ambulatory Visit (INDEPENDENT_AMBULATORY_CARE_PROVIDER_SITE_OTHER): Payer: Medicaid Other | Admitting: Obstetrics

## 2013-11-07 VITALS — BP 104/67 | Temp 98.3°F | Wt 158.0 lb

## 2013-11-07 DIAGNOSIS — Z3482 Encounter for supervision of other normal pregnancy, second trimester: Secondary | ICD-10-CM

## 2013-11-07 DIAGNOSIS — Z1389 Encounter for screening for other disorder: Secondary | ICD-10-CM

## 2013-11-07 DIAGNOSIS — Z3481 Encounter for supervision of other normal pregnancy, first trimester: Secondary | ICD-10-CM

## 2013-11-07 DIAGNOSIS — Z3480 Encounter for supervision of other normal pregnancy, unspecified trimester: Secondary | ICD-10-CM

## 2013-11-07 LAB — POCT URINALYSIS DIPSTICK
Blood, UA: NEGATIVE
Ketones, UA: NEGATIVE
Spec Grav, UA: 1.015

## 2013-11-07 NOTE — Progress Notes (Signed)
HR - 87 Pt in office for routine OB visit, reports having periods of dizziness , ringing in ears and feeling hot, when up on feet at times, goes away after pt sits down, also having sharp leg and back pain that makes her feel like she is going to fall.

## 2013-11-08 LAB — AFP, QUAD SCREEN
Age Alone: 1:495 {titer}
Curr Gest Age: 15.4 wks.days
Down Syndrome Scr Risk Est: 1:2900 {titer}
HCG, Total: 57202 m[IU]/mL
INH: 236.4 pg/mL
Interpretation-AFP: NEGATIVE
MoM for INH: 1.24
Open Spina bifida: NEGATIVE
Osb Risk: 1:4100 {titer}
uE3 Mom: 1.64

## 2013-11-26 ENCOUNTER — Ambulatory Visit (INDEPENDENT_AMBULATORY_CARE_PROVIDER_SITE_OTHER): Payer: Medicaid Other

## 2013-11-26 ENCOUNTER — Other Ambulatory Visit: Payer: Self-pay | Admitting: Obstetrics

## 2013-11-26 DIAGNOSIS — O30009 Twin pregnancy, unspecified number of placenta and unspecified number of amniotic sacs, unspecified trimester: Secondary | ICD-10-CM

## 2013-11-26 DIAGNOSIS — Z1389 Encounter for screening for other disorder: Secondary | ICD-10-CM

## 2013-11-26 MED ORDER — FOLIC ACID 1 MG PO TABS: 1.0000 mg | ORAL_TABLET | Freq: Every day | ORAL | Status: DC

## 2013-11-27 ENCOUNTER — Encounter: Payer: Self-pay | Admitting: Obstetrics

## 2013-11-27 ENCOUNTER — Encounter: Payer: Self-pay | Admitting: Obstetrics & Gynecology

## 2013-11-27 LAB — US OB DETAIL + 14 WK

## 2013-12-03 ENCOUNTER — Ambulatory Visit (HOSPITAL_COMMUNITY)
Admission: RE | Admit: 2013-12-03 | Discharge: 2013-12-03 | Disposition: A | Discharge status: Home / Self Care | Payer: Medicaid Other | Source: Ambulatory Visit | Attending: Obstetrics | Admitting: Obstetrics

## 2013-12-03 ENCOUNTER — Encounter (HOSPITAL_COMMUNITY): Payer: Self-pay

## 2013-12-03 ENCOUNTER — Other Ambulatory Visit: Payer: Self-pay | Admitting: Obstetrics

## 2013-12-03 VITALS — BP 123/68 | HR 102 | Wt 163.0 lb

## 2013-12-03 DIAGNOSIS — O30009 Twin pregnancy, unspecified number of placenta and unspecified number of amniotic sacs, unspecified trimester: Secondary | ICD-10-CM | POA: Insufficient documentation

## 2013-12-03 DIAGNOSIS — O30039 Twin pregnancy, monochorionic/diamniotic, unspecified trimester: Secondary | ICD-10-CM

## 2013-12-03 DIAGNOSIS — O34219 Maternal care for unspecified type scar from previous cesarean delivery: Secondary | ICD-10-CM | POA: Insufficient documentation

## 2013-12-03 DIAGNOSIS — O30049 Twin pregnancy, dichorionic/diamniotic, unspecified trimester: Secondary | ICD-10-CM | POA: Insufficient documentation

## 2013-12-03 NOTE — Progress Notes (Signed)
Dorothy Lopez  was seen today for an ultrasound appointment.  See full report in [facility].  Impression: MC/DA twin gestation with best date of 19 2/7 weeks Had recent ultrasound for fetal anatomy in the office Limited ultrasound performed to determine chorionicity A thin wispy membrane is noted without a twin peak sign Normal amniotic fluid volume x 2 Bladders visualized x 2  Recommendations: Recommend follow up every 2 weeks: Fetal growth in 2 weeks; fluid check / TTTS screen in 4 weeks. See separate consult letter  Alpha Gula, MD

## 2013-12-04 ENCOUNTER — Other Ambulatory Visit (HOSPITAL_COMMUNITY): Payer: Self-pay | Admitting: Maternal and Fetal Medicine

## 2013-12-04 DIAGNOSIS — O30009 Twin pregnancy, unspecified number of placenta and unspecified number of amniotic sacs, unspecified trimester: Secondary | ICD-10-CM

## 2013-12-05 ENCOUNTER — Encounter: Payer: Self-pay | Admitting: Obstetrics

## 2013-12-05 ENCOUNTER — Ambulatory Visit (INDEPENDENT_AMBULATORY_CARE_PROVIDER_SITE_OTHER): Payer: Medicaid Other | Admitting: Obstetrics

## 2013-12-05 VITALS — BP 109/68 | Temp 98.9°F | Wt 164.0 lb

## 2013-12-05 DIAGNOSIS — Z3482 Encounter for supervision of other normal pregnancy, second trimester: Secondary | ICD-10-CM

## 2013-12-05 DIAGNOSIS — Z3480 Encounter for supervision of other normal pregnancy, unspecified trimester: Secondary | ICD-10-CM

## 2013-12-05 DIAGNOSIS — O30009 Twin pregnancy, unspecified number of placenta and unspecified number of amniotic sacs, unspecified trimester: Secondary | ICD-10-CM

## 2013-12-05 LAB — POCT URINALYSIS DIPSTICK
Bilirubin, UA: NEGATIVE
Blood, UA: NEGATIVE
Leukocytes, UA: NEGATIVE
Nitrite, UA: NEGATIVE
Protein, UA: NEGATIVE
Urobilinogen, UA: NEGATIVE
pH, UA: 5

## 2013-12-05 NOTE — Progress Notes (Signed)
HR - 94 Pt in office today for routine OB visit, denies any concerns at present

## 2013-12-14 NOTE — Progress Notes (Signed)
MATERNAL FETAL MEDICINE CONSULT  Patient Name: Dorothy Lopez Medical Record Number:  [id number] Date of Birth: [birth year] Requesting Physician Name:  No att. providers found Date of Service: 12/14/2013  Chief Complaint Mono-di twin gestation  History of Present Illness Dorothy Lopez was seen today for prenatal diagnosis secondary to mono-di twin gestation at the request of Coral Ceo, MD.  The patient is a 32 y.o. G4P3003,at [redacted]w[redacted]d on 12/03/2013 with an EDD of 04/27/2014, by Last Menstrual Period dating method.  She has no complaints. She denies any medical problems, including thyroid disease, hypertension, diabetes, and seizure disorder.  Patient History OB History  Gravida Para Term Preterm AB SAB TAB Ectopic Multiple Living  4 3 3       3     # Outcome Date GA Lbr Len/2nd Weight Sex Delivery Anes PTL Lv  4 CUR           3 TRM 10/15/10 [redacted]w[redacted]d  9 lb 9.6 oz (4.355 kg) M CS   Y     Comments: No complications  2 TRM 07/20/07 [redacted]w[redacted]d  7 lb 9.6 oz (3.447 kg) F CS   Y     Comments: No complications  1 TRM 12/27/99 [redacted]w[redacted]d  7 lb (3.175 kg) F LTCS        Comments: heart decel      No past medical history on file.  Past Surgical History  Procedure Laterality Date  . Cesarean section      x 3    History   Social History  . Marital Status: Single    Spouse Name: N/A    Number of Children: N/A  . Years of Education: N/A   Social History Main Topics  . Smoking status: Never Smoker   . Smokeless tobacco: Never Used  . Alcohol Use: No  . Drug Use: No  . Sexual Activity: Yes    Partners: Male   Other Topics Concern  . Not on file   Social History Narrative  . No narrative on file    Family History  Problem Relation Age of Onset  . Diabetes Neg Hx   . Hypertension Neg Hx   . Cancer Neg Hx   . Heart disease Neg Hx   . Kidney disease Brother     deceased at age 33  . Kidney disease Maternal Grandmother    In addition, the patient has no family history of mental  retardation, birth defects, or genetic diseases.  Physical Examination Filed Vitals:   12/03/13 1117  BP: 123/68  Pulse: 102    Assessment and Recommendations  - Monochorionic- diamniotic twin pregnancy: A limited US today revealed a wispy membrane consistent with mono-di twin gestation. Fetal growth was consistent with previously established dating and growth was concordant. Amniotic fluid was normal for both twins, and both bladders were seen. Please see [place] report for full details. I had a relatively in depth discussion with Ms. Hopwood regarding her mono-di gestation. I discussed with the patient the biology of twinning and explained with images important concepts behind twin chorionicity. I also discussed the increased risk of complications in twins, including an increased risk of gestational diabetes, hypertensive disorders of pregnancy, fetal growth restriction, and premature delivery with all twin gestations. I explained the general idea of twin-twin transfusion syndrome which affects up to 15% of all mono-di twin gestations. Because of the risk of TTTS, Ms. Desch will require ultrasounds every 2 weeks to assess for twin-twin transfusion.  In addition, fetal  growth should be assessed every 4 weeks.  Once or twice weekly fetal testing should be begun at 32 weeks and continued until delivery. The [employer] recommends delivery of mono-di twin between 43 - 37+[redacted] weeks gestational age.  However, given the morbidity associated with preterm birth, it is our general recommendation that elective delivery should not be undertaken before 36 weeks of gestation, though delivery can be effected at an earlier gestational age if medically or obstetrically indicated.   - Prior cesarean delivery x 3: Ms. Weedman has had 3 prior cesarean sections, including an unsuccessful TOLAC, and will require a repeat for this gestation. I discussed the risks associated with multiple  repeat cesarean sections, including increased risk of placenta previa, invasive placentation, and uterine rupture. I indicated that generally obstetricians recommend limiting the number of cesarean deliveries to 3 or less. The patient, when we spoke, was undecided regarding sterilization.  Screening for aneuploidy: the patient has already had a low-risk quad screen that returned low risk for aneuploidy.   I spent 40 minutes with Ms. Slabach today of which 50% was face-to-face counseling.  Thank you for referring Ms. Francesconi to the [facility].  Please do not hesitate to contact us with questions.   Fredrik Rigger, MD

## 2013-12-14 NOTE — Addendum Note (Signed)
Encounter addended by: Fredrik Rigger, MD on: 12/14/2013  7:11 PM<BR>     Documentation filed: Follow-up Section, LOS Section, Visit Diagnoses, Notes Section

## 2013-12-17 NOTE — Progress Notes (Signed)
Concur with assessment and plan as outlined above.  Alpha Gula, MD

## 2013-12-18 ENCOUNTER — Encounter (HOSPITAL_COMMUNITY): Payer: Self-pay

## 2013-12-18 ENCOUNTER — Ambulatory Visit (HOSPITAL_COMMUNITY)
Admission: RE | Admit: 2013-12-18 | Discharge: 2013-12-18 | Disposition: A | Discharge status: Home / Self Care | Payer: Medicaid Other | Source: Ambulatory Visit | Attending: Obstetrics | Admitting: Obstetrics

## 2013-12-18 VITALS — BP 120/70 | HR 93 | Wt 164.0 lb

## 2013-12-18 DIAGNOSIS — O358XX0 Maternal care for other (suspected) fetal abnormality and damage, not applicable or unspecified: Secondary | ICD-10-CM | POA: Insufficient documentation

## 2013-12-18 DIAGNOSIS — Z363 Encounter for antenatal screening for malformations: Secondary | ICD-10-CM | POA: Insufficient documentation

## 2013-12-18 DIAGNOSIS — O30009 Twin pregnancy, unspecified number of placenta and unspecified number of amniotic sacs, unspecified trimester: Secondary | ICD-10-CM | POA: Insufficient documentation

## 2013-12-18 DIAGNOSIS — O34219 Maternal care for unspecified type scar from previous cesarean delivery: Secondary | ICD-10-CM | POA: Insufficient documentation

## 2013-12-18 DIAGNOSIS — Z1389 Encounter for screening for other disorder: Secondary | ICD-10-CM | POA: Insufficient documentation

## 2013-12-18 NOTE — Progress Notes (Signed)
Dorothy Lopez  was seen today for an ultrasound appointment.  See full report in [facility].  Impression: MC/DA twin gestation with best dates of 21 3/7 weeks Fetal growth is concordant  Twin A: Cephalic, maternal right, left lateral placenta Normal anatomic fetal survey Somewhat limited views of the fetal heart obtained due to fetal position (LVOT, ductus) Normal bladder, normal amniotic fluid volume  Twin B: Variable presentation, maternal left, left lateral placenta Normal anatomic fetal survey Somewhat limited views of the fetal heart obtained due to fetal position (LVOT, ductus) Normal bladeer, normal amniotic fluid volume  Recommendations: Fetal echo due to monochorionic placentation (ordered) Follow up in 2 weeks to fluid check / TTTS screen Follow up in 4 weeks for interval growth  Alpha Gula, MD

## 2013-12-27 DIAGNOSIS — O30039 Twin pregnancy, monochorionic/diamniotic, unspecified trimester: Secondary | ICD-10-CM | POA: Insufficient documentation

## 2014-01-02 ENCOUNTER — Ambulatory Visit (INDEPENDENT_AMBULATORY_CARE_PROVIDER_SITE_OTHER): Payer: Medicaid Other | Admitting: Obstetrics

## 2014-01-02 ENCOUNTER — Encounter: Payer: Self-pay | Admitting: Obstetrics

## 2014-01-02 ENCOUNTER — Ambulatory Visit (HOSPITAL_COMMUNITY)
Admission: RE | Admit: 2014-01-02 | Discharge: 2014-01-02 | Disposition: A | Discharge status: Home / Self Care | Payer: Medicaid Other | Source: Ambulatory Visit | Attending: Obstetrics | Admitting: Obstetrics

## 2014-01-02 ENCOUNTER — Other Ambulatory Visit: Payer: Medicaid Other

## 2014-01-02 VITALS — BP 103/71 | Temp 98.0°F | Wt 167.0 lb

## 2014-01-02 DIAGNOSIS — O30009 Twin pregnancy, unspecified number of placenta and unspecified number of amniotic sacs, unspecified trimester: Secondary | ICD-10-CM | POA: Insufficient documentation

## 2014-01-02 DIAGNOSIS — Z3480 Encounter for supervision of other normal pregnancy, unspecified trimester: Secondary | ICD-10-CM

## 2014-01-02 DIAGNOSIS — O34219 Maternal care for unspecified type scar from previous cesarean delivery: Secondary | ICD-10-CM | POA: Insufficient documentation

## 2014-01-02 NOTE — Progress Notes (Signed)
Dorothy Lopez  was seen today for an ultrasound appointment.  See full report in [facility].  Impression: MC/DA twin gestation at 23 4/7 weeks Limited ultrasound performed to screen for TTTS Had recent normal fetal echo x 2  Twin A Maternal right, breech, left lateral placenta Normal amniotic fluid volume Bladder visualized  Twin B Maternal left, variable presentation, left lateral placenta Normal amniotic fluid volume Bladder visualized  Recommendations: Follow up in 2 weeks for interval growth 4 weeks for fluid check / TTTS screen  [name], MD

## 2014-01-02 NOTE — Progress Notes (Signed)
P 91 Patient reports she is doing well- she does have some tightness at times- occurred once this week. Otherwise patient is doing well at this time without concerns.

## 2014-01-03 LAB — CBC
HCT: 27.7 % — ABNORMAL LOW (ref 36.0–46.0)
Hemoglobin: 9 g/dL — ABNORMAL LOW (ref 12.0–15.0)
MCH: 27.6 pg (ref 26.0–34.0)
MCHC: 32.5 g/dL (ref 30.0–36.0)
MCV: 85 fL (ref 78.0–100.0)
Platelets: 240 10*3/uL (ref 150–400)
RBC: 3.26 MIL/uL — AB (ref 3.87–5.11)
RDW: 14.9 % (ref 11.5–15.5)
WBC: 7.4 10*3/uL (ref 4.0–10.5)

## 2014-01-03 LAB — GLUCOSE TOLERANCE, 2 HOURS W/ 1HR
GLUCOSE, FASTING: 59 mg/dL — AB (ref 70–99)
GLUCOSE: 94 mg/dL (ref 70–170)
Glucose, 2 hour: 79 mg/dL (ref 70–139)

## 2014-01-03 LAB — RPR

## 2014-01-03 LAB — HIV ANTIBODY (ROUTINE TESTING W REFLEX): HIV: NONREACTIVE

## 2014-01-10 ENCOUNTER — Other Ambulatory Visit: Payer: Self-pay | Admitting: *Deleted

## 2014-01-14 ENCOUNTER — Encounter: Payer: Self-pay | Admitting: Obstetrics

## 2014-01-14 ENCOUNTER — Ambulatory Visit (INDEPENDENT_AMBULATORY_CARE_PROVIDER_SITE_OTHER): Payer: Medicaid Other | Admitting: Obstetrics

## 2014-01-14 ENCOUNTER — Ambulatory Visit (HOSPITAL_COMMUNITY)
Admission: RE | Admit: 2014-01-14 | Discharge: 2014-01-14 | Disposition: A | Discharge status: Home / Self Care | Payer: Medicaid Other | Source: Ambulatory Visit | Attending: Obstetrics | Admitting: Obstetrics

## 2014-01-14 VITALS — BP 123/62 | Temp 97.2°F | Wt 164.0 lb

## 2014-01-14 DIAGNOSIS — O30009 Twin pregnancy, unspecified number of placenta and unspecified number of amniotic sacs, unspecified trimester: Secondary | ICD-10-CM

## 2014-01-14 DIAGNOSIS — O34219 Maternal care for unspecified type scar from previous cesarean delivery: Secondary | ICD-10-CM | POA: Insufficient documentation

## 2014-01-14 DIAGNOSIS — D649 Anemia, unspecified: Secondary | ICD-10-CM

## 2014-01-14 DIAGNOSIS — Z3480 Encounter for supervision of other normal pregnancy, unspecified trimester: Secondary | ICD-10-CM

## 2014-01-14 MED ORDER — FUSION PLUS PO CAPS: 1.0000 | ORAL_CAPSULE | Freq: Every day | ORAL | Status: DC

## 2014-01-14 NOTE — Progress Notes (Signed)
Pulse: 94 Patient states there are no concerns.

## 2014-01-28 ENCOUNTER — Other Ambulatory Visit: Payer: Self-pay | Admitting: Obstetrics

## 2014-01-28 DIAGNOSIS — O30009 Twin pregnancy, unspecified number of placenta and unspecified number of amniotic sacs, unspecified trimester: Secondary | ICD-10-CM

## 2014-01-29 ENCOUNTER — Encounter: Payer: Self-pay | Admitting: Obstetrics

## 2014-01-29 ENCOUNTER — Ambulatory Visit (HOSPITAL_COMMUNITY)
Admission: RE | Admit: 2014-01-29 | Discharge: 2014-01-29 | Disposition: A | Discharge status: Home / Self Care | Payer: Medicaid Other | Source: Ambulatory Visit | Attending: Obstetrics | Admitting: Obstetrics

## 2014-01-29 ENCOUNTER — Ambulatory Visit (INDEPENDENT_AMBULATORY_CARE_PROVIDER_SITE_OTHER): Payer: Medicaid Other | Admitting: Obstetrics

## 2014-01-29 DIAGNOSIS — O30009 Twin pregnancy, unspecified number of placenta and unspecified number of amniotic sacs, unspecified trimester: Secondary | ICD-10-CM

## 2014-01-29 DIAGNOSIS — IMO0001 Reserved for inherently not codable concepts without codable children: Secondary | ICD-10-CM

## 2014-01-29 DIAGNOSIS — O34219 Maternal care for unspecified type scar from previous cesarean delivery: Secondary | ICD-10-CM | POA: Insufficient documentation

## 2014-02-11 ENCOUNTER — Other Ambulatory Visit: Payer: Self-pay | Admitting: Obstetrics

## 2014-02-11 DIAGNOSIS — O30009 Twin pregnancy, unspecified number of placenta and unspecified number of amniotic sacs, unspecified trimester: Secondary | ICD-10-CM

## 2014-02-12 ENCOUNTER — Ambulatory Visit (HOSPITAL_COMMUNITY)
Admission: RE | Admit: 2014-02-12 | Discharge: 2014-02-12 | Disposition: A | Discharge status: Home / Self Care | Payer: Medicaid Other | Source: Ambulatory Visit | Attending: Obstetrics | Admitting: Obstetrics

## 2014-02-12 ENCOUNTER — Ambulatory Visit (INDEPENDENT_AMBULATORY_CARE_PROVIDER_SITE_OTHER): Payer: Medicaid Other | Admitting: Obstetrics

## 2014-02-12 ENCOUNTER — Encounter: Payer: Self-pay | Admitting: Obstetrics

## 2014-02-12 VITALS — BP 110/64 | HR 93 | Wt 166.2 lb

## 2014-02-12 VITALS — BP 118/60 | Temp 97.2°F | Wt 165.0 lb

## 2014-02-12 DIAGNOSIS — O34219 Maternal care for unspecified type scar from previous cesarean delivery: Secondary | ICD-10-CM | POA: Insufficient documentation

## 2014-02-12 DIAGNOSIS — O30009 Twin pregnancy, unspecified number of placenta and unspecified number of amniotic sacs, unspecified trimester: Secondary | ICD-10-CM | POA: Insufficient documentation

## 2014-02-12 DIAGNOSIS — Z3480 Encounter for supervision of other normal pregnancy, unspecified trimester: Secondary | ICD-10-CM

## 2014-02-12 LAB — POCT URINALYSIS DIPSTICK
Bilirubin, UA: NEGATIVE
Blood, UA: NEGATIVE
Glucose, UA: NEGATIVE
Ketones, UA: NEGATIVE
Nitrite, UA: NEGATIVE
Protein, UA: NEGATIVE
Spec Grav, UA: 1.015
Urobilinogen, UA: NEGATIVE
pH, UA: 7

## 2014-02-12 NOTE — Progress Notes (Signed)
Pulse 89, patient has no concerns

## 2014-02-19 ENCOUNTER — Telehealth: Payer: Self-pay | Admitting: *Deleted

## 2014-02-19 NOTE — Telephone Encounter (Signed)
Patient called c/o cyst on L hip area where her underwear crosses. It is very painful and she has been using warm compresses. Per Dr Clearance CootsHarper- try to refrain from garments that restrict that area. Appointment given for 10:30 tomorrow.

## 2014-02-20 ENCOUNTER — Encounter: Payer: Medicaid Other | Admitting: Obstetrics

## 2014-02-25 ENCOUNTER — Other Ambulatory Visit: Payer: Self-pay | Admitting: Obstetrics

## 2014-02-25 DIAGNOSIS — O30009 Twin pregnancy, unspecified number of placenta and unspecified number of amniotic sacs, unspecified trimester: Secondary | ICD-10-CM

## 2014-02-26 ENCOUNTER — Ambulatory Visit (HOSPITAL_COMMUNITY)
Admission: RE | Admit: 2014-02-26 | Discharge: 2014-02-26 | Disposition: A | Discharge status: Home / Self Care | Payer: Medicaid Other | Source: Ambulatory Visit | Attending: Obstetrics | Admitting: Obstetrics

## 2014-02-26 ENCOUNTER — Ambulatory Visit (INDEPENDENT_AMBULATORY_CARE_PROVIDER_SITE_OTHER): Payer: Medicaid Other | Admitting: Obstetrics

## 2014-02-26 ENCOUNTER — Encounter: Payer: Self-pay | Admitting: Obstetrics

## 2014-02-26 VITALS — BP 115/59 | HR 98 | Wt 170.0 lb

## 2014-02-26 VITALS — Temp 98.8°F | Wt 170.0 lb

## 2014-02-26 DIAGNOSIS — O228X9 Other venous complications in pregnancy, unspecified trimester: Secondary | ICD-10-CM

## 2014-02-26 DIAGNOSIS — R52 Pain, unspecified: Secondary | ICD-10-CM

## 2014-02-26 DIAGNOSIS — K649 Unspecified hemorrhoids: Secondary | ICD-10-CM

## 2014-02-26 DIAGNOSIS — O2240 Hemorrhoids in pregnancy, unspecified trimester: Secondary | ICD-10-CM

## 2014-02-26 DIAGNOSIS — Z3480 Encounter for supervision of other normal pregnancy, unspecified trimester: Secondary | ICD-10-CM

## 2014-02-26 DIAGNOSIS — O34219 Maternal care for unspecified type scar from previous cesarean delivery: Secondary | ICD-10-CM | POA: Insufficient documentation

## 2014-02-26 DIAGNOSIS — O30009 Twin pregnancy, unspecified number of placenta and unspecified number of amniotic sacs, unspecified trimester: Secondary | ICD-10-CM | POA: Insufficient documentation

## 2014-02-26 DIAGNOSIS — O30039 Twin pregnancy, monochorionic/diamniotic, unspecified trimester: Secondary | ICD-10-CM | POA: Insufficient documentation

## 2014-02-26 MED ORDER — HYDROCORTISONE ACETATE 25 MG RE SUPP: 25.0000 mg | Freq: Two times a day (BID) | RECTAL | Status: DC

## 2014-02-26 MED ORDER — OXYCODONE HCL 10 MG PO TABS: 10.0000 mg | ORAL_TABLET | Freq: Four times a day (QID) | ORAL | Status: DC | PRN

## 2014-02-26 NOTE — Progress Notes (Signed)
Pulse: 106 Patient states she is having back pain. Patient states she thinks she may have Hemorids.

## 2014-03-04 ENCOUNTER — Inpatient Hospital Stay (HOSPITAL_COMMUNITY)
Admission: AD | Admit: 2014-03-04 | Discharge: 2014-03-04 | Disposition: A | Discharge status: Home / Self Care | Payer: Medicaid Other | Source: Ambulatory Visit | Attending: Obstetrics | Admitting: Obstetrics

## 2014-03-04 ENCOUNTER — Encounter (HOSPITAL_COMMUNITY): Payer: Self-pay

## 2014-03-04 ENCOUNTER — Other Ambulatory Visit: Payer: Self-pay | Admitting: Obstetrics

## 2014-03-04 ENCOUNTER — Encounter (HOSPITAL_COMMUNITY): Payer: Self-pay | Admitting: General Practice

## 2014-03-04 ENCOUNTER — Ambulatory Visit (HOSPITAL_COMMUNITY)
Admission: RE | Admit: 2014-03-04 | Discharge: 2014-03-04 | Disposition: A | Discharge status: Home / Self Care | Payer: Medicaid Other | Source: Ambulatory Visit | Attending: Obstetrics | Admitting: Obstetrics

## 2014-03-04 DIAGNOSIS — O289 Unspecified abnormal findings on antenatal screening of mother: Secondary | ICD-10-CM | POA: Diagnosis not present

## 2014-03-04 DIAGNOSIS — O34219 Maternal care for unspecified type scar from previous cesarean delivery: Secondary | ICD-10-CM | POA: Insufficient documentation

## 2014-03-04 DIAGNOSIS — Z3689 Encounter for other specified antenatal screening: Secondary | ICD-10-CM

## 2014-03-04 DIAGNOSIS — O30039 Twin pregnancy, monochorionic/diamniotic, unspecified trimester: Secondary | ICD-10-CM | POA: Diagnosis not present

## 2014-03-04 DIAGNOSIS — O368390 Maternal care for abnormalities of the fetal heart rate or rhythm, unspecified trimester, not applicable or unspecified: Secondary | ICD-10-CM | POA: Insufficient documentation

## 2014-03-04 DIAGNOSIS — O30009 Twin pregnancy, unspecified number of placenta and unspecified number of amniotic sacs, unspecified trimester: Secondary | ICD-10-CM

## 2014-03-04 NOTE — MAU Note (Signed)
Patient states she was sent from [facility]FM after one of her twins failed the NST. Patient denies pain, bleeding or leaking and reports good fetal movement.

## 2014-03-04 NOTE — MAU Provider Note (Signed)
History     CSN: [id number]  Arrival date and time: 03/04/14 1441   First Provider Initiated Contact with Patient 03/04/14 1559      Chief Complaint  Patient presents with  . Non-stress Test   HPI  Ms. Tiajuana AmassDonetta R Woloszyn is a 33 y.o. female [phone] at 3213w2d mono/di twins who presents from MFM for further fetal monitoring. The patient was in MFM today for an NST that was non-reactive; BPP ordered and showed Twin A 6/8 (-2 for breathing), Twin B 8/8. She reports good fetal movement, denies LOF, vaginal bleeding, vaginal itching/burning, urinary symptoms, h/a, dizziness, n/v, or fever/chills.    OB History   Grav Para Term Preterm Abortions TAB SAB Ect Mult Living   4 3 3       3       History reviewed. No pertinent past medical history.  Past Surgical History  Procedure Laterality Date  . Cesarean section      x 3    Family History  Problem Relation Age of Onset  . Diabetes Neg Hx   . Hypertension Neg Hx   . Cancer Neg Hx   . Heart disease Neg Hx   . Kidney disease Brother     deceased at age 33  . Kidney disease Maternal Grandmother     History  Substance Use Topics  . Smoking status: Never Smoker   . Smokeless tobacco: Never Used  . Alcohol Use: No    Allergies: No Known Allergies  Prescriptions prior to admission  Medication Sig Dispense Refill  . folic acid (FOLVITE) 1 MG tablet Take 1 tablet (1 mg total) by mouth daily.  90 tablet  3  . Iron-FA-B Cmp-C-Biot-Probiotic (FUSION PLUS) CAPS Take 1 capsule by mouth daily.  30 capsule  11  . Prenat-FeCbn-FeAspGl-FA-Omega (OB COMPLETE PETITE) 35-5-1-200 MG CAPS Take 1 capsule by mouth daily.  30 capsule  11  . Witch Hazel (PREPARATION H EX) Apply 1 application topically at bedtime as needed (hemorrhoids).      . Oxycodone HCl 10 MG TABS Take 1 tablet (10 mg total) by mouth every 6 (six) hours as needed.  40 tablet  0    Review of Systems  Constitutional: Negative for fever and chills.  Gastrointestinal: Negative  for nausea, vomiting, abdominal pain, diarrhea and constipation.  Genitourinary: Negative for dysuria, urgency, frequency and hematuria.       No vaginal discharge. No vaginal bleeding. No dysuria.    Physical Exam   Blood pressure 128/65, pulse 108, temperature 98.4 F (36.9 C), temperature source Oral, resp. rate 16, height 5' (1.524 m), weight 77.202 kg (170 lb 3.2 oz), last menstrual period 07/21/2013, SpO2 100.00%.  Physical Exam  Constitutional: She is oriented to person, place, and time. She appears well-developed and well-nourished. No distress.  HENT:  Head: Normocephalic.  Eyes: Pupils are equal, round, and reactive to light.  Neck: Neck supple.  Respiratory: Effort normal.  Musculoskeletal: Normal range of motion.  Neurological: She is alert and oriented to person, place, and time.  Skin: Skin is warm. She is not diaphoretic.  Psychiatric: Her behavior is normal.    MAU Course  Procedures None  MDM  Fetal Tracing: Twin A Baseline: 145 bpm  Variability: Moderate  Accelerations: 15x15 Decelerations: None Toco: occasional UI   Fetal Tracing: Twin B  Baseline: 140 bpm  Variability: Moderate  Accelerations: 15x15 Decelerations: variable  Toco: occasional UI   Consulted with Dr. Clearance CootsHarper and asked him to review fetal  tracing. Ok to discharge home   Assessment and Plan   A:  1. NST (non-stress test) reactive    P:  Discharge home in stable condition Kick counts discussed at length Keep your follow up appt with MFM and Dr. Clearance Coots Return to MAU as needed, if symptoms worsen   Iona Hansen Nawaal Alling, NP  03/04/2014, 6:17 PM

## 2014-03-04 NOTE — ED Notes (Signed)
Dorothy Lopez left the office abruptly.  She stated that she "had to go get her sick child."  We instructed her that she needs to be seen in MAU; however, she did not state whether she would return or not.

## 2014-03-04 NOTE — Progress Notes (Signed)
[facility] ultrasound  Indication: 33 yr old [id number] at 6375w2d with monochorionic/diamniotic twin gestation for BPP secondary to nonreactive NST.  Findings: 1. Monochorionic/diamniotic twin gestation; the dividing membrane is seen. 2. Posterior placenta without evidence of previa. 3. Normal amniotic fluid volume for both fetuses. 4. Normal stomach and bladder seen in each fetus. 5. Biophysical profile is 6/8 (-2 for breathing) for twin A; and 8/8 for twin B. 6. Twin A is in cephalic presentation; twin B is in transverse presentation.   Recommendations: 1. Monochorionic/diamniotic twin gestation: - previously counseled - fetal growth every 4 weeks; due on 03/07/14 - evaluate for twin twin transfusion syndrome every 2 weeks; no evidence on today's ultrasound - continue antenatal testing- discussed with patient that twin A overall BPP was 6/10 and twin B was 8/10 therefore recommend prolonged monitoring and if twin A is not reactive repeat BPP in 6 hours; spoke with Dr. Clearance CootsHarper and advised patient to go to triage for monitoring- patient reports she had to leave to pick up her son who is ill but will come back for monitoring. I discussed this would be against medical advice as twin A did not pass testing and there is an increased risk of stillbirth or fetal hypoxic injury (although unlikely). Patient reports she understands and will return for monitoring today. I encouraged her to return as soon as possible. Discussed with Dr. Clearance CootsHarper - recommend delivery at 36-37 weeks or sooner if clinically indicated  Eulis FosterKristen Fitz Matsuo, MD

## 2014-03-06 ENCOUNTER — Other Ambulatory Visit (HOSPITAL_COMMUNITY): Payer: Self-pay | Admitting: Maternal and Fetal Medicine

## 2014-03-06 DIAGNOSIS — O30009 Twin pregnancy, unspecified number of placenta and unspecified number of amniotic sacs, unspecified trimester: Secondary | ICD-10-CM

## 2014-03-07 ENCOUNTER — Ambulatory Visit (HOSPITAL_COMMUNITY)
Admission: RE | Admit: 2014-03-07 | Discharge: 2014-03-07 | Disposition: A | Discharge status: Home / Self Care | Payer: Medicaid Other | Source: Ambulatory Visit | Attending: Obstetrics | Admitting: Obstetrics

## 2014-03-07 ENCOUNTER — Other Ambulatory Visit (HOSPITAL_COMMUNITY): Payer: Medicaid Other

## 2014-03-07 ENCOUNTER — Other Ambulatory Visit (HOSPITAL_COMMUNITY): Payer: Self-pay | Admitting: Maternal and Fetal Medicine

## 2014-03-07 DIAGNOSIS — O34219 Maternal care for unspecified type scar from previous cesarean delivery: Secondary | ICD-10-CM | POA: Insufficient documentation

## 2014-03-07 DIAGNOSIS — O30009 Twin pregnancy, unspecified number of placenta and unspecified number of amniotic sacs, unspecified trimester: Secondary | ICD-10-CM

## 2014-03-11 ENCOUNTER — Ambulatory Visit (INDEPENDENT_AMBULATORY_CARE_PROVIDER_SITE_OTHER): Payer: Medicaid Other | Admitting: Obstetrics

## 2014-03-11 ENCOUNTER — Ambulatory Visit (HOSPITAL_COMMUNITY)
Admission: RE | Admit: 2014-03-11 | Discharge: 2014-03-11 | Disposition: A | Discharge status: Home / Self Care | Payer: Medicaid Other | Source: Ambulatory Visit | Attending: Obstetrics | Admitting: Obstetrics

## 2014-03-11 VITALS — BP 113/73 | Temp 98.7°F | Wt 170.0 lb

## 2014-03-11 DIAGNOSIS — O289 Unspecified abnormal findings on antenatal screening of mother: Secondary | ICD-10-CM | POA: Insufficient documentation

## 2014-03-11 DIAGNOSIS — O30009 Twin pregnancy, unspecified number of placenta and unspecified number of amniotic sacs, unspecified trimester: Secondary | ICD-10-CM | POA: Insufficient documentation

## 2014-03-11 DIAGNOSIS — Z3480 Encounter for supervision of other normal pregnancy, unspecified trimester: Secondary | ICD-10-CM

## 2014-03-11 DIAGNOSIS — O30039 Twin pregnancy, monochorionic/diamniotic, unspecified trimester: Secondary | ICD-10-CM | POA: Insufficient documentation

## 2014-03-11 NOTE — Progress Notes (Signed)
Pulse: 98 Patient denies any concerns.  

## 2014-03-12 ENCOUNTER — Encounter: Payer: Self-pay | Admitting: Obstetrics

## 2014-03-14 ENCOUNTER — Ambulatory Visit (HOSPITAL_COMMUNITY)
Admission: RE | Admit: 2014-03-14 | Discharge: 2014-03-14 | Disposition: A | Discharge status: Home / Self Care | Payer: Medicaid Other | Source: Ambulatory Visit | Attending: Obstetrics | Admitting: Obstetrics

## 2014-03-14 DIAGNOSIS — O34219 Maternal care for unspecified type scar from previous cesarean delivery: Secondary | ICD-10-CM | POA: Insufficient documentation

## 2014-03-14 DIAGNOSIS — O30039 Twin pregnancy, monochorionic/diamniotic, unspecified trimester: Secondary | ICD-10-CM | POA: Insufficient documentation

## 2014-03-14 DIAGNOSIS — O30009 Twin pregnancy, unspecified number of placenta and unspecified number of amniotic sacs, unspecified trimester: Secondary | ICD-10-CM

## 2014-03-17 ENCOUNTER — Other Ambulatory Visit: Payer: Self-pay | Admitting: Obstetrics

## 2014-03-17 DIAGNOSIS — O30009 Twin pregnancy, unspecified number of placenta and unspecified number of amniotic sacs, unspecified trimester: Secondary | ICD-10-CM

## 2014-03-18 ENCOUNTER — Ambulatory Visit (INDEPENDENT_AMBULATORY_CARE_PROVIDER_SITE_OTHER): Payer: Medicaid Other | Admitting: Obstetrics

## 2014-03-18 ENCOUNTER — Ambulatory Visit (HOSPITAL_COMMUNITY)
Admission: RE | Admit: 2014-03-18 | Discharge: 2014-03-18 | Disposition: A | Discharge status: Home / Self Care | Payer: Medicaid Other | Source: Ambulatory Visit | Attending: Obstetrics | Admitting: Obstetrics

## 2014-03-18 DIAGNOSIS — O34219 Maternal care for unspecified type scar from previous cesarean delivery: Secondary | ICD-10-CM | POA: Insufficient documentation

## 2014-03-18 DIAGNOSIS — O30009 Twin pregnancy, unspecified number of placenta and unspecified number of amniotic sacs, unspecified trimester: Secondary | ICD-10-CM

## 2014-03-20 ENCOUNTER — Ambulatory Visit (INDEPENDENT_AMBULATORY_CARE_PROVIDER_SITE_OTHER): Payer: Medicaid Other | Admitting: Obstetrics

## 2014-03-20 ENCOUNTER — Encounter: Payer: Medicaid Other | Admitting: Obstetrics

## 2014-03-20 VITALS — BP 117/67 | Temp 97.6°F | Wt 168.0 lb

## 2014-03-20 DIAGNOSIS — Z3480 Encounter for supervision of other normal pregnancy, unspecified trimester: Secondary | ICD-10-CM

## 2014-03-20 DIAGNOSIS — O30009 Twin pregnancy, unspecified number of placenta and unspecified number of amniotic sacs, unspecified trimester: Secondary | ICD-10-CM

## 2014-03-20 LAB — POCT URINALYSIS DIPSTICK
BILIRUBIN UA: NEGATIVE
GLUCOSE UA: NEGATIVE
KETONES UA: NEGATIVE
Nitrite, UA: NEGATIVE
Protein, UA: NEGATIVE
RBC UA: NEGATIVE
SPEC GRAV UA: 1.015
Urobilinogen, UA: NEGATIVE
pH, UA: 7

## 2014-03-20 NOTE — Progress Notes (Signed)
Pulse 92 Pt is doing well. 

## 2014-03-21 ENCOUNTER — Ambulatory Visit (HOSPITAL_COMMUNITY)
Admission: RE | Admit: 2014-03-21 | Discharge: 2014-03-21 | Disposition: A | Discharge status: Home / Self Care | Payer: Medicaid Other | Source: Ambulatory Visit | Attending: Obstetrics | Admitting: Obstetrics

## 2014-03-21 DIAGNOSIS — O30009 Twin pregnancy, unspecified number of placenta and unspecified number of amniotic sacs, unspecified trimester: Secondary | ICD-10-CM

## 2014-03-21 DIAGNOSIS — O30039 Twin pregnancy, monochorionic/diamniotic, unspecified trimester: Secondary | ICD-10-CM | POA: Insufficient documentation

## 2014-03-23 ENCOUNTER — Encounter: Payer: Self-pay | Admitting: Obstetrics

## 2014-03-25 ENCOUNTER — Ambulatory Visit (HOSPITAL_COMMUNITY)
Admission: RE | Admit: 2014-03-25 | Discharge: 2014-03-25 | Disposition: A | Discharge status: Home / Self Care | Payer: Medicaid Other | Source: Ambulatory Visit | Attending: Obstetrics | Admitting: Obstetrics

## 2014-03-25 ENCOUNTER — Other Ambulatory Visit: Payer: Self-pay | Admitting: Obstetrics

## 2014-03-25 DIAGNOSIS — O30009 Twin pregnancy, unspecified number of placenta and unspecified number of amniotic sacs, unspecified trimester: Secondary | ICD-10-CM | POA: Insufficient documentation

## 2014-03-25 DIAGNOSIS — O34219 Maternal care for unspecified type scar from previous cesarean delivery: Secondary | ICD-10-CM | POA: Insufficient documentation

## 2014-03-28 ENCOUNTER — Ambulatory Visit (HOSPITAL_COMMUNITY)
Admission: RE | Admit: 2014-03-28 | Discharge: 2014-03-28 | Disposition: A | Discharge status: Home / Self Care | Payer: Medicaid Other | Source: Ambulatory Visit | Attending: Maternal and Fetal Medicine | Admitting: Maternal and Fetal Medicine

## 2014-03-28 ENCOUNTER — Ambulatory Visit (INDEPENDENT_AMBULATORY_CARE_PROVIDER_SITE_OTHER): Payer: Medicaid Other | Admitting: Advanced Practice Midwife

## 2014-03-28 ENCOUNTER — Other Ambulatory Visit: Payer: Self-pay | Admitting: Obstetrics

## 2014-03-28 ENCOUNTER — Encounter: Payer: Self-pay | Admitting: Advanced Practice Midwife

## 2014-03-28 ENCOUNTER — Ambulatory Visit (HOSPITAL_COMMUNITY)
Admission: RE | Admit: 2014-03-28 | Discharge: 2014-03-28 | Disposition: A | Discharge status: Home / Self Care | Payer: Medicaid Other | Source: Ambulatory Visit | Attending: Obstetrics | Admitting: Obstetrics

## 2014-03-28 VITALS — BP 113/74 | Temp 98.7°F | Wt 171.0 lb

## 2014-03-28 DIAGNOSIS — O30009 Twin pregnancy, unspecified number of placenta and unspecified number of amniotic sacs, unspecified trimester: Secondary | ICD-10-CM

## 2014-03-28 DIAGNOSIS — O30039 Twin pregnancy, monochorionic/diamniotic, unspecified trimester: Secondary | ICD-10-CM | POA: Insufficient documentation

## 2014-03-28 DIAGNOSIS — Z3480 Encounter for supervision of other normal pregnancy, unspecified trimester: Secondary | ICD-10-CM

## 2014-03-28 DIAGNOSIS — O34219 Maternal care for unspecified type scar from previous cesarean delivery: Secondary | ICD-10-CM | POA: Insufficient documentation

## 2014-03-28 NOTE — Progress Notes (Signed)
Pulse: 101 Patient states she is just here to set up her appointment for her c-section.

## 2014-03-28 NOTE — Progress Notes (Signed)
Patient was just seen at [facility] and came here to schedule her C-sxn. Dorothy Lopez and Dorothy Lopez not available patient to RTC next week to schedule this and for ROB.  Dorothy Lopez Dorothy Lopez CNM

## 2014-04-01 ENCOUNTER — Ambulatory Visit (INDEPENDENT_AMBULATORY_CARE_PROVIDER_SITE_OTHER): Payer: Medicaid Other | Admitting: Obstetrics

## 2014-04-01 ENCOUNTER — Ambulatory Visit (HOSPITAL_COMMUNITY)
Admission: RE | Admit: 2014-04-01 | Discharge: 2014-04-01 | Disposition: A | Discharge status: Home / Self Care | Payer: Medicaid Other | Source: Ambulatory Visit | Attending: Obstetrics | Admitting: Obstetrics

## 2014-04-01 ENCOUNTER — Encounter (HOSPITAL_COMMUNITY): Payer: Self-pay

## 2014-04-01 DIAGNOSIS — O30009 Twin pregnancy, unspecified number of placenta and unspecified number of amniotic sacs, unspecified trimester: Secondary | ICD-10-CM | POA: Insufficient documentation

## 2014-04-01 DIAGNOSIS — O34219 Maternal care for unspecified type scar from previous cesarean delivery: Secondary | ICD-10-CM | POA: Insufficient documentation

## 2014-04-03 ENCOUNTER — Encounter: Payer: Self-pay | Admitting: Obstetrics

## 2014-04-03 ENCOUNTER — Encounter (HOSPITAL_COMMUNITY): Payer: Self-pay | Admitting: Pharmacist

## 2014-04-03 NOTE — Progress Notes (Signed)
S:  Twins.  36 weeks.  Previous C/S x 3. O:  Seen and had U/S today at [facility].  FHR 150 / 150 bpm. A/P:  Twins at 3 weeks.  MFM recommends delivery at 37 weeks.  Will schedule C/S.   100% of 10 minute visit done in consultation.

## 2014-04-04 ENCOUNTER — Encounter (HOSPITAL_COMMUNITY)
Admission: AD | Disposition: A | Discharge status: Home / Self Care | Payer: Self-pay | Source: Ambulatory Visit | Attending: Obstetrics

## 2014-04-04 ENCOUNTER — Ambulatory Visit (HOSPITAL_COMMUNITY)
Admission: RE | Admit: 2014-04-04 | Discharge: 2014-04-04 | Disposition: A | Discharge status: Home / Self Care | Payer: Medicaid Other | Source: Ambulatory Visit | Attending: Obstetrics | Admitting: Obstetrics

## 2014-04-04 ENCOUNTER — Encounter (HOSPITAL_COMMUNITY): Payer: Self-pay | Admitting: *Deleted

## 2014-04-04 ENCOUNTER — Inpatient Hospital Stay (HOSPITAL_COMMUNITY): Payer: Medicaid Other | Admitting: Anesthesiology

## 2014-04-04 ENCOUNTER — Inpatient Hospital Stay (HOSPITAL_COMMUNITY)
Admission: AD | Admit: 2014-04-04 | Discharge: 2014-04-07 | DRG: 765 | Disposition: A | Discharge status: Home / Self Care | Payer: Medicaid Other | Source: Ambulatory Visit | Attending: Obstetrics | Admitting: Obstetrics

## 2014-04-04 ENCOUNTER — Encounter (HOSPITAL_COMMUNITY): Payer: Medicaid Other | Admitting: Anesthesiology

## 2014-04-04 DIAGNOSIS — O99892 Other specified diseases and conditions complicating childbirth: Secondary | ICD-10-CM | POA: Diagnosis present

## 2014-04-04 DIAGNOSIS — Z98891 History of uterine scar from previous surgery: Secondary | ICD-10-CM

## 2014-04-04 DIAGNOSIS — O900 Disruption of cesarean delivery wound: Secondary | ICD-10-CM | POA: Diagnosis not present

## 2014-04-04 DIAGNOSIS — D649 Anemia, unspecified: Secondary | ICD-10-CM | POA: Diagnosis present

## 2014-04-04 DIAGNOSIS — O30039 Twin pregnancy, monochorionic/diamniotic, unspecified trimester: Secondary | ICD-10-CM

## 2014-04-04 DIAGNOSIS — O30009 Twin pregnancy, unspecified number of placenta and unspecified number of amniotic sacs, unspecified trimester: Secondary | ICD-10-CM | POA: Diagnosis present

## 2014-04-04 DIAGNOSIS — O9989 Other specified diseases and conditions complicating pregnancy, childbirth and the puerperium: Secondary | ICD-10-CM

## 2014-04-04 DIAGNOSIS — O34219 Maternal care for unspecified type scar from previous cesarean delivery: Principal | ICD-10-CM | POA: Diagnosis present

## 2014-04-04 DIAGNOSIS — O9902 Anemia complicating childbirth: Secondary | ICD-10-CM | POA: Diagnosis present

## 2014-04-04 DIAGNOSIS — N736 Female pelvic peritoneal adhesions (postinfective): Secondary | ICD-10-CM | POA: Diagnosis present

## 2014-04-04 LAB — CBC
HEMATOCRIT: 30.8 % — AB (ref 36.0–46.0)
HEMOGLOBIN: 10 g/dL — AB (ref 12.0–15.0)
MCH: 26.8 pg (ref 26.0–34.0)
MCHC: 32.5 g/dL (ref 30.0–36.0)
MCV: 82.6 fL (ref 78.0–100.0)
Platelets: 164 10*3/uL (ref 150–400)
RBC: 3.73 MIL/uL — ABNORMAL LOW (ref 3.87–5.11)
RDW: 15.1 % (ref 11.5–15.5)
WBC: 7.2 10*3/uL (ref 4.0–10.5)

## 2014-04-04 LAB — PREPARE RBC (CROSSMATCH)

## 2014-04-04 SURGERY — Surgical Case
Anesthesia: Spinal | Site: Abdomen

## 2014-04-04 MED ORDER — SIMETHICONE 80 MG PO CHEW
80.0000 mg | CHEWABLE_TABLET | ORAL | Status: DC
  Administered 2014-04-06 (×2): 80 mg via ORAL
  Filled 2014-04-04 (×3): qty 1

## 2014-04-04 MED ORDER — SCOPOLAMINE 1 MG/3DAYS TD PT72
MEDICATED_PATCH | TRANSDERMAL | Status: AC
  Administered 2014-04-04: 1.5 mg via TRANSDERMAL
  Filled 2014-04-04: qty 1

## 2014-04-04 MED ORDER — METOCLOPRAMIDE HCL 5 MG/ML IJ SOLN
INTRAMUSCULAR | Status: AC
  Filled 2014-04-04: qty 2

## 2014-04-04 MED ORDER — ZOLPIDEM TARTRATE 5 MG PO TABS: 5.0000 mg | ORAL_TABLET | Freq: Every evening | ORAL | Status: DC | PRN

## 2014-04-04 MED ORDER — FENTANYL CITRATE 0.05 MG/ML IJ SOLN
INTRAMUSCULAR | Status: DC | PRN
  Administered 2014-04-04: 15 ug via INTRATHECAL

## 2014-04-04 MED ORDER — NALOXONE HCL 1 MG/ML IJ SOLN: 1.0000 ug/kg/h | INTRAVENOUS | Status: DC | PRN

## 2014-04-04 MED ORDER — DIPHENHYDRAMINE HCL 25 MG PO CAPS: 25.0000 mg | ORAL_CAPSULE | Freq: Four times a day (QID) | ORAL | Status: DC | PRN

## 2014-04-04 MED ORDER — LANOLIN HYDROUS EX OINT: 1.0000 "application " | TOPICAL_OINTMENT | CUTANEOUS | Status: DC | PRN

## 2014-04-04 MED ORDER — LACTATED RINGERS IV SOLN
INTRAVENOUS | Status: DC
  Administered 2014-04-04 (×3): via INTRAVENOUS

## 2014-04-04 MED ORDER — METOCLOPRAMIDE HCL 5 MG/ML IJ SOLN: 10.0000 mg | Freq: Three times a day (TID) | INTRAMUSCULAR | Status: DC | PRN

## 2014-04-04 MED ORDER — KETOROLAC TROMETHAMINE 30 MG/ML IJ SOLN: 30.0000 mg | Freq: Four times a day (QID) | INTRAMUSCULAR | Status: DC | PRN

## 2014-04-04 MED ORDER — BUPIVACAINE IN DEXTROSE 0.75-8.25 % IT SOLN
INTRATHECAL | Status: DC | PRN
  Administered 2014-04-04: 1.2 mL via INTRATHECAL

## 2014-04-04 MED ORDER — NALBUPHINE HCL 10 MG/ML IJ SOLN: 5.0000 mg | INTRAMUSCULAR | Status: DC | PRN

## 2014-04-04 MED ORDER — FENTANYL CITRATE 0.05 MG/ML IJ SOLN
INTRAMUSCULAR | Status: AC
  Filled 2014-04-04: qty 2

## 2014-04-04 MED ORDER — DIBUCAINE 1 % RE OINT: 1.0000 "application " | TOPICAL_OINTMENT | RECTAL | Status: DC | PRN

## 2014-04-04 MED ORDER — OXYTOCIN 10 UNIT/ML IJ SOLN
INTRAMUSCULAR | Status: AC
  Filled 2014-04-04: qty 4

## 2014-04-04 MED ORDER — PHENYLEPHRINE 8 MG IN D5W 100 ML (0.08MG/ML) PREMIX OPTIME
INJECTION | INTRAVENOUS | Status: DC | PRN
  Administered 2014-04-04: 60 ug/min via INTRAVENOUS

## 2014-04-04 MED ORDER — ONDANSETRON HCL 4 MG PO TABS: 4.0000 mg | ORAL_TABLET | ORAL | Status: DC | PRN

## 2014-04-04 MED ORDER — ONDANSETRON HCL 4 MG/2ML IJ SOLN
INTRAMUSCULAR | Status: DC | PRN
  Administered 2014-04-04: 4 mg via INTRAVENOUS

## 2014-04-04 MED ORDER — CEFAZOLIN SODIUM-DEXTROSE 2-3 GM-% IV SOLR
INTRAVENOUS | Status: DC | PRN
  Administered 2014-04-04: 2 g via INTRAVENOUS

## 2014-04-04 MED ORDER — ONDANSETRON HCL 4 MG/2ML IJ SOLN
INTRAMUSCULAR | Status: AC
  Filled 2014-04-04: qty 2

## 2014-04-04 MED ORDER — SCOPOLAMINE 1 MG/3DAYS TD PT72
1.0000 | MEDICATED_PATCH | Freq: Once | TRANSDERMAL | Status: DC
  Administered 2014-04-04: 1.5 mg via TRANSDERMAL

## 2014-04-04 MED ORDER — SODIUM CHLORIDE 0.9 % IJ SOLN: 3.0000 mL | INTRAMUSCULAR | Status: DC | PRN

## 2014-04-04 MED ORDER — TRIAMCINOLONE ACETONIDE 40 MG/ML IJ SUSP
40.0000 mg | Freq: Once | INTRAMUSCULAR | Status: AC
  Administered 2014-04-04: 40 mg via INTRADERMAL
  Filled 2014-04-04: qty 1

## 2014-04-04 MED ORDER — LACTATED RINGERS IV SOLN
INTRAVENOUS | Status: DC
  Administered 2014-04-05 (×3): via INTRAVENOUS

## 2014-04-04 MED ORDER — ONDANSETRON HCL 4 MG/2ML IJ SOLN: 4.0000 mg | Freq: Three times a day (TID) | INTRAMUSCULAR | Status: DC | PRN

## 2014-04-04 MED ORDER — DIPHENHYDRAMINE HCL 25 MG PO CAPS: 25.0000 mg | ORAL_CAPSULE | ORAL | Status: DC | PRN

## 2014-04-04 MED ORDER — ONDANSETRON HCL 4 MG/2ML IJ SOLN
4.0000 mg | INTRAMUSCULAR | Status: DC | PRN
  Administered 2014-04-04: 4 mg via INTRAVENOUS
  Filled 2014-04-04: qty 2

## 2014-04-04 MED ORDER — NALOXONE HCL 0.4 MG/ML IJ SOLN: 0.4000 mg | INTRAMUSCULAR | Status: DC | PRN

## 2014-04-04 MED ORDER — BUPIVACAINE HCL (PF) 0.25 % IJ SOLN
INTRAMUSCULAR | Status: DC | PRN
  Administered 2014-04-04: 20 mL

## 2014-04-04 MED ORDER — FENTANYL CITRATE 0.05 MG/ML IJ SOLN: 25.0000 ug | INTRAMUSCULAR | Status: DC | PRN

## 2014-04-04 MED ORDER — PRENATAL MULTIVITAMIN CH
1.0000 | ORAL_TABLET | Freq: Every day | ORAL | Status: DC
  Administered 2014-04-06: 1 via ORAL
  Filled 2014-04-04 (×2): qty 1

## 2014-04-04 MED ORDER — BUPIVACAINE HCL (PF) 0.25 % IJ SOLN
INTRAMUSCULAR | Status: AC
  Filled 2014-04-04: qty 20

## 2014-04-04 MED ORDER — MORPHINE SULFATE 0.5 MG/ML IJ SOLN
INTRAMUSCULAR | Status: AC
  Filled 2014-04-04: qty 10

## 2014-04-04 MED ORDER — OXYTOCIN 40 UNITS IN LACTATED RINGERS INFUSION - SIMPLE MED: 62.5000 mL/h | INTRAVENOUS | Status: AC

## 2014-04-04 MED ORDER — MEPERIDINE HCL 25 MG/ML IJ SOLN: 6.2500 mg | INTRAMUSCULAR | Status: DC | PRN

## 2014-04-04 MED ORDER — LACTATED RINGERS IV BOLUS (SEPSIS)
1000.0000 mL | Freq: Once | INTRAVENOUS | Status: AC
  Administered 2014-04-04: 1000 mL via INTRAVENOUS

## 2014-04-04 MED ORDER — HYDROMORPHONE HCL PF 1 MG/ML IJ SOLN
INTRAMUSCULAR | Status: AC
  Filled 2014-04-04: qty 1

## 2014-04-04 MED ORDER — LACTATED RINGERS IV SOLN
INTRAVENOUS | Status: DC | PRN
  Administered 2014-04-04: 19:00:00 via INTRAVENOUS

## 2014-04-04 MED ORDER — DIPHENHYDRAMINE HCL 50 MG/ML IJ SOLN: 12.5000 mg | INTRAMUSCULAR | Status: DC | PRN

## 2014-04-04 MED ORDER — MORPHINE SULFATE (PF) 0.5 MG/ML IJ SOLN
INTRAMUSCULAR | Status: DC | PRN
  Administered 2014-04-04: .1 mg via INTRATHECAL

## 2014-04-04 MED ORDER — KETOROLAC TROMETHAMINE 30 MG/ML IJ SOLN
INTRAMUSCULAR | Status: AC
  Filled 2014-04-04: qty 1

## 2014-04-04 MED ORDER — CEFAZOLIN SODIUM-DEXTROSE 2-3 GM-% IV SOLR
2.0000 g | Freq: Once | INTRAVENOUS | Status: AC
  Administered 2014-04-04: 2 g via INTRAVENOUS
  Filled 2014-04-04: qty 50

## 2014-04-04 MED ORDER — MENTHOL 3 MG MT LOZG: 1.0000 | LOZENGE | OROMUCOSAL | Status: DC | PRN

## 2014-04-04 MED ORDER — MEDROXYPROGESTERONE ACETATE 150 MG/ML IM SUSP: 150.0000 mg | INTRAMUSCULAR | Status: DC | PRN

## 2014-04-04 MED ORDER — SODIUM CHLORIDE 0.9 % IJ SOLN
INTRAMUSCULAR | Status: AC
  Filled 2014-04-04: qty 10

## 2014-04-04 MED ORDER — METOCLOPRAMIDE HCL 5 MG/ML IJ SOLN
10.0000 mg | Freq: Once | INTRAMUSCULAR | Status: AC | PRN
  Administered 2014-04-04: 10 mg via INTRAVENOUS

## 2014-04-04 MED ORDER — PHENYLEPHRINE 40 MCG/ML (10ML) SYRINGE FOR IV PUSH (FOR BLOOD PRESSURE SUPPORT)
PREFILLED_SYRINGE | INTRAVENOUS | Status: AC
  Filled 2014-04-04: qty 5

## 2014-04-04 MED ORDER — TETANUS-DIPHTH-ACELL PERTUSSIS 5-2.5-18.5 LF-MCG/0.5 IM SUSP: 0.5000 mL | Freq: Once | INTRAMUSCULAR | Status: DC

## 2014-04-04 MED ORDER — OXYCODONE-ACETAMINOPHEN 5-325 MG PO TABS
1.0000 | ORAL_TABLET | ORAL | Status: DC | PRN
  Administered 2014-04-05 (×2): 2 via ORAL
  Administered 2014-04-05: 1 via ORAL
  Administered 2014-04-06 (×2): 2 via ORAL
  Administered 2014-04-06: 1 via ORAL
  Administered 2014-04-07: 2 via ORAL
  Filled 2014-04-04 (×2): qty 2
  Filled 2014-04-04: qty 1
  Filled 2014-04-04 (×4): qty 2

## 2014-04-04 MED ORDER — KETOROLAC TROMETHAMINE 60 MG/2ML IM SOLN
60.0000 mg | Freq: Once | INTRAMUSCULAR | Status: DC | PRN
  Filled 2014-04-04: qty 2

## 2014-04-04 MED ORDER — OXYTOCIN 40 UNITS IN LACTATED RINGERS INFUSION - SIMPLE MED
INTRAVENOUS | Status: DC | PRN
  Administered 2014-04-04: 40 [IU] via INTRAVENOUS

## 2014-04-04 MED ORDER — CITRIC ACID-SODIUM CITRATE 334-500 MG/5ML PO SOLN
ORAL | Status: AC
  Administered 2014-04-04: 30 mL via ORAL
  Filled 2014-04-04: qty 15

## 2014-04-04 MED ORDER — MEPERIDINE HCL 25 MG/ML IJ SOLN
6.2500 mg | INTRAMUSCULAR | Status: DC | PRN
Start: 2014-04-04 — End: 2014-04-04

## 2014-04-04 MED ORDER — SENNOSIDES-DOCUSATE SODIUM 8.6-50 MG PO TABS
2.0000 | ORAL_TABLET | ORAL | Status: DC
  Administered 2014-04-06 (×2): 2 via ORAL
  Filled 2014-04-04 (×2): qty 2

## 2014-04-04 MED ORDER — DIPHENHYDRAMINE HCL 50 MG/ML IJ SOLN: 25.0000 mg | INTRAMUSCULAR | Status: DC | PRN

## 2014-04-04 MED ORDER — HYDROMORPHONE HCL PF 1 MG/ML IJ SOLN
INTRAMUSCULAR | Status: DC | PRN
  Administered 2014-04-04: 1 mg via INTRAVENOUS

## 2014-04-04 MED ORDER — NALBUPHINE HCL 10 MG/ML IJ SOLN
5.0000 mg | INTRAMUSCULAR | Status: DC | PRN
Start: 2014-04-04 — End: 2014-04-07

## 2014-04-04 MED ORDER — KETOROLAC TROMETHAMINE 30 MG/ML IJ SOLN
30.0000 mg | Freq: Four times a day (QID) | INTRAMUSCULAR | Status: DC | PRN
  Administered 2014-04-04: 30 mg via INTRAVENOUS

## 2014-04-04 MED ORDER — SIMETHICONE 80 MG PO CHEW
80.0000 mg | CHEWABLE_TABLET | Freq: Three times a day (TID) | ORAL | Status: DC
  Administered 2014-04-05 – 2014-04-07 (×7): 80 mg via ORAL
  Filled 2014-04-04 (×7): qty 1

## 2014-04-04 MED ORDER — WITCH HAZEL-GLYCERIN EX PADS: 1.0000 "application " | MEDICATED_PAD | CUTANEOUS | Status: DC | PRN

## 2014-04-04 MED ORDER — IBUPROFEN 600 MG PO TABS
600.0000 mg | ORAL_TABLET | Freq: Four times a day (QID) | ORAL | Status: DC
  Administered 2014-04-05 – 2014-04-07 (×9): 600 mg via ORAL
  Filled 2014-04-04 (×9): qty 1

## 2014-04-04 MED ORDER — PHENYLEPHRINE HCL 10 MG/ML IJ SOLN
INTRAMUSCULAR | Status: AC
Start: 2014-04-04 — End: 2014-04-04
  Filled 2014-04-04: qty 1

## 2014-04-04 MED ORDER — CITRIC ACID-SODIUM CITRATE 334-500 MG/5ML PO SOLN
30.0000 mL | Freq: Once | ORAL | Status: AC
  Administered 2014-04-04: 30 mL via ORAL

## 2014-04-04 MED ORDER — SIMETHICONE 80 MG PO CHEW: 80.0000 mg | CHEWABLE_TABLET | ORAL | Status: DC | PRN

## 2014-04-04 SURGICAL SUPPLY — 46 items
CANISTER WOUND CARE 500ML ATS (WOUND CARE) IMPLANT
CLAMP CORD UMBIL (MISCELLANEOUS) IMPLANT
CLOTH BEACON ORANGE TIMEOUT ST (SAFETY) ×3 IMPLANT
CONTAINER PREFILL 10% NBF 15ML (MISCELLANEOUS) ×6 IMPLANT
DERMABOND ADVANCED (GAUZE/BANDAGES/DRESSINGS) ×2
DERMABOND ADVANCED .7 DNX12 (GAUZE/BANDAGES/DRESSINGS) ×1 IMPLANT
DRAPE LG THREE QUARTER DISP (DRAPES) IMPLANT
DRSG OPSITE POSTOP 4X10 (GAUZE/BANDAGES/DRESSINGS) ×3 IMPLANT
DRSG VAC ATS LRG SENSATRAC (GAUZE/BANDAGES/DRESSINGS) IMPLANT
DRSG VAC ATS MED SENSATRAC (GAUZE/BANDAGES/DRESSINGS) IMPLANT
DRSG VAC ATS SM SENSATRAC (GAUZE/BANDAGES/DRESSINGS) IMPLANT
DURAPREP 26ML APPLICATOR (WOUND CARE) ×3 IMPLANT
ELECT REM PT RETURN 9FT ADLT (ELECTROSURGICAL) ×3
ELECTRODE REM PT RTRN 9FT ADLT (ELECTROSURGICAL) ×1 IMPLANT
EXTRACTOR VACUUM M CUP 4 TUBE (SUCTIONS) ×2 IMPLANT
EXTRACTOR VACUUM M CUP 4' TUBE (SUCTIONS) ×1
GLOVE BIO SURGEON STRL SZ8 (GLOVE) ×6 IMPLANT
GOWN STRL REUS W/TWL LRG LVL3 (GOWN DISPOSABLE) ×6 IMPLANT
KIT ABG SYR 3ML LUER SLIP (SYRINGE) IMPLANT
NEEDLE HYPO 22GX1.5 SAFETY (NEEDLE) ×3 IMPLANT
NEEDLE HYPO 25X5/8 SAFETYGLIDE (NEEDLE) ×3 IMPLANT
NS IRRIG 1000ML POUR BTL (IV SOLUTION) ×3 IMPLANT
PACK C SECTION WH (CUSTOM PROCEDURE TRAY) ×3 IMPLANT
PAD ABD 8X7 1/2 STERILE (GAUZE/BANDAGES/DRESSINGS) ×3 IMPLANT
PAD OB MATERNITY 4.3X12.25 (PERSONAL CARE ITEMS) ×3 IMPLANT
RTRCTR C-SECT PINK 25CM LRG (MISCELLANEOUS) ×3 IMPLANT
SPONGE LAP 18X18 X RAY DECT (DISPOSABLE) ×6 IMPLANT
SPONGE SURGIFOAM ABS GEL 12-7 (HEMOSTASIS) ×6 IMPLANT
STAPLER VISISTAT 35W (STAPLE) ×3 IMPLANT
SUT GUT PLAIN 0 CT-3 TAN 27 (SUTURE) IMPLANT
SUT MNCRL 0 VIOLET CTX 36 (SUTURE) ×4 IMPLANT
SUT MNCRL AB 4-0 PS2 18 (SUTURE) IMPLANT
SUT MON AB 2-0 CT1 27 (SUTURE) ×3 IMPLANT
SUT MON AB 3-0 SH 27 (SUTURE)
SUT MON AB 3-0 SH27 (SUTURE) IMPLANT
SUT MONOCRYL 0 CTX 36 (SUTURE) ×8
SUT PDS AB 0 CTX 60 (SUTURE) IMPLANT
SUT PLAIN 2 0 XLH (SUTURE) IMPLANT
SUT VIC AB 0 CTX 36 (SUTURE) ×2
SUT VIC AB 0 CTX36XBRD ANBCTRL (SUTURE) ×1 IMPLANT
SUT VIC AB 2-0 CT1 27 (SUTURE)
SUT VIC AB 2-0 CT1 TAPERPNT 27 (SUTURE) IMPLANT
SYR 20CC LL (SYRINGE) ×3 IMPLANT
TOWEL OR 17X24 6PK STRL BLUE (TOWEL DISPOSABLE) ×3 IMPLANT
TRAY FOLEY CATH 14FR (SET/KITS/TRAYS/PACK) ×3 IMPLANT
WATER STERILE IRR 1000ML POUR (IV SOLUTION) ×3 IMPLANT

## 2014-04-04 NOTE — Lactation Note (Signed)
This [facility]. Lactation Consultation Note  Patient Name: Dorothy Lopez Dorothy Lopez [id number]'s Date: 04/04/2014 Reason for consult: Initial assessment;Other (Comment) (charting for exclusion)   Maternal Data Formula Feeding for Exclusion: Yes Reason for exclusion: Mother's choice to formula feed on admision Infant to breast within first hour of birth: No Breastfeeding delayed due to:: Infant status Has patient been taught Hand Expression?: No Does the patient have breastfeeding experience prior to this delivery?: No  Feeding Feeding Type: Bottle Fed - Formula Nipple Type: Slow - flow  LATCH Score/Interventions                      Lactation Tools Discussed/Used     Consult Status Consult Status: Complete    Zara ChessJoanne P Cambelle Suchecki 04/04/2014, 10:52 PM

## 2014-04-04 NOTE — H&P (Signed)
Dorothy Lopez is a 33 y.o. female presenting for repeat C/S. Maternal Medical History:  Fetal activity: Perceived fetal activity is normal.   Last perceived fetal movement was within the past hour.    Prenatal complications: no prenatal complications Prenatal Complications - Diabetes: none.    OB History   Grav Para Term Preterm Abortions TAB SAB Ect Mult Living   4 3 3       3      History reviewed. No pertinent past medical history. Past Surgical History  Procedure Laterality Date  . Cesarean section      x 3   Family History: family history includes Kidney disease in her brother and maternal grandmother. There is no history of Diabetes, Hypertension, Cancer, or Heart disease. Social History:  reports that she has never smoked. She has never used smokeless tobacco. She reports that she does not drink alcohol or use illicit drugs.   Prenatal Transfer Tool  Maternal Diabetes: No Genetic Screening: Normal Maternal Ultrasounds/Referrals: Normal Fetal Ultrasounds or other Referrals:  None Maternal Substance Abuse:  No Significant Maternal Medications:  None Significant Maternal Lab Results:  None Other Comments:  None  Review of Systems  All other systems reviewed and are negative.     Height 5' (1.524 m), weight 174 lb (78.926 kg), last menstrual period 07/21/2013. Maternal Exam:  Abdomen: Patient reports no abdominal tenderness.   Physical Exam  Nursing note and vitals reviewed. Constitutional: She is oriented to person, place, and time. She appears well-developed and well-nourished.  HENT:  Head: Normocephalic and atraumatic.  Eyes: Conjunctivae are normal. Pupils are equal, round, and reactive to light.  Neck: Normal range of motion. Neck supple.  Cardiovascular: Normal rate and regular rhythm.   Respiratory: Effort normal and breath sounds normal.  GI: Soft.  Musculoskeletal: Normal range of motion.  Neurological: She is alert and oriented to person, place,  and time.  Skin: Skin is warm and dry.  Psychiatric: She has a normal mood and affect. Her behavior is normal. Judgment and thought content normal.    Prenatal labs: ABO, Rh: O/POS/-- (10/30 1425) Antibody: NEG (10/30 1425) Rubella: 7.39 (10/30 1425) RPR: NON REAC (01/15 1353)  HBsAg: NEGATIVE (10/30 1425)  HIV: NON REACTIVE (01/15 1353)  GBS:     Assessment/Plan: 36.5 weeks.  Twins ( monochorionic / diamniotic ).  Previous C/S x 3.  Presents for repeat C/S.   Brock Badharles A Harper 04/04/2014, 3:47 PM

## 2014-04-04 NOTE — Anesthesia Postprocedure Evaluation (Signed)
  Anesthesia Post-op Note  Patient: Dorothy Lopez  Procedure(s) Performed: Procedure(s): CESAREAN SECTION (N/A)  Last Vitals:  Filed Vitals:   04/04/14 2100  BP:   Pulse: 76  Temp:   Resp: 25     Patient is awake, responsive, moving her legs, and has signs of resolution of her numbness. Pain and nausea are reasonably well controlled. Vital signs are stable and clinically acceptable. Oxygen saturation is clinically acceptable. There are no apparent anesthetic complications at this time. Patient is ready for discharge.

## 2014-04-04 NOTE — Anesthesia Procedure Notes (Addendum)
Spinal  Patient location during procedure: OR Start time: 04/04/2014 6:36 PM Staffing Performed by: anesthesiologist  Preanesthetic Checklist Completed: patient identified, site marked, surgical consent, pre-op evaluation, timeout performed, IV checked, risks and benefits discussed and monitors and equipment checked Spinal Block Patient position: sitting Prep: site prepped and draped and DuraPrep Patient monitoring: cardiac monitor, continuous pulse ox, blood pressure and heart rate Approach: midline Location: L3-4 Injection technique: catheter Needle Needle type: Tuohy and Pencan  Needle gauge: 24 G Needle length: 12.7 cm Needle insertion depth: 5.5 cm Catheter type: closed end flexible Catheter size: 19 g Catheter at skin depth: 10.5 cm Assessment Sensory level: T4 Additional Notes CSE for 4th C/S (per pt spinal was wearing off at end of 3rd C/S).  LOR to air at 5.5 cm with tuohy, Pencan via Mayvilletuohy with immediate return of clear free flow CSF.  SAB dose given, pencan removed, saline flush 2 ml via tuohy, epidural catheter threaded easily, secured at 10.5 cm at skin.  EPIDURAL CATHETER NOT TESTED  Patient tolerated procedure well with no apparent complications. Jasmine DecemberA. Cassidy, MD

## 2014-04-04 NOTE — OR Nursing (Signed)
Dr. Clearance CootsHarper unable to perform bilateral tubal ligation due to "massive adhesions"

## 2014-04-04 NOTE — Transfer of Care (Signed)
Immediate Anesthesia Transfer of Care Note  Patient: Dorothy Lopez  Procedure(s) Performed: Procedure(s): CESAREAN SECTION (N/A)  Patient Location: PACU  Anesthesia Type:Spinal and Epidural  Level of Consciousness: awake, alert  and oriented  Airway & Oxygen Therapy: Patient Spontanous Breathing  Post-op Assessment: Report given to PACU RN and Post -op Vital signs reviewed and stable  Post vital signs: Reviewed and stable  Complications: No apparent anesthesia complications

## 2014-04-04 NOTE — Anesthesia Preprocedure Evaluation (Addendum)
Anesthesia Evaluation  Patient identified by MRN, date of birth, ID band Patient awake    Reviewed: Allergy & Precautions, H&P , NPO status , Patient's Chart, lab work & pertinent test results, reviewed documented beta blocker date and time   History of Anesthesia Complications Negative for: history of anesthetic complications  Airway Mallampati: III TM Distance: >3 FB Neck ROM: full    Dental  (+) Teeth Intact   Pulmonary neg pulmonary ROS,  breath sounds clear to auscultation        Cardiovascular negative cardio ROS  Rhythm:regular Rate:Normal + Systolic murmurs    Neuro/Psych negative neurological ROS  negative psych ROS   GI/Hepatic negative GI ROS, Neg liver ROS,   Endo/Other  negative endocrine ROSBMI 34  Renal/GU negative Renal ROS     Musculoskeletal   Abdominal   Peds  Hematology  (+) anemia ,   Anesthesia Other Findings Last ate at 0830 - cheeseburger  Reproductive/Obstetrics (+) Pregnancy (twins, 36 weeks, h/o c/s x3 for C/S #4)                        Anesthesia Physical Anesthesia Plan  ASA: II  Anesthesia Plan: Combined Spinal and Epidural   Post-op Pain Management:    Induction:   Airway Management Planned:   Additional Equipment:   Intra-op Plan:   Post-operative Plan:   Informed Consent: I have reviewed the patients History and Physical, chart, labs and discussed the procedure including the risks, benefits and alternatives for the proposed anesthesia with the patient or authorized representative who has indicated his/her understanding and acceptance.     Plan Discussed with: CRNA and Surgeon  Anesthesia Plan Comments:        Anesthesia Quick Evaluation

## 2014-04-04 NOTE — ED Notes (Signed)
Pt scheduled for c/s at 5pm today.  Instructed patient to return to MAU registration around 230-3pm to check in.  Instructed patient to be NPO.

## 2014-04-04 NOTE — Op Note (Signed)
   Cesarean Section Procedure Note   Dorothy Lopez   04/04/2014  Indications: Scheduled Proceedure/Maternal Request   Pre-operative Diagnosis: repeat cesarean section, twins, desires sterilization.   Post-operative Diagnosis: Same   Surgeon: Brock Badharles A Romeka Scifres  Assistants: Francoise CeoMarshall, Bernard  Anesthesia: spinal  Procedure Details:  The patient was seen in the Holding Room. The risks, benefits, complications, treatment options, and expected outcomes were discussed with the patient. The patient concurred with the proposed plan, giving informed consent. The patient was identified as Dorothy Lopez and the procedure verified as C-Section Delivery. A Time Out was held and the above information confirmed.  After induction of anesthesia, the patient was draped and prepped in the usual sterile manner. A transverse incision was made and carried down through the subcutaneous tissue to the fascia. The fascial incision was made and extended transversely. The fascia was separated from the underlying rectus tissue superiorly and inferiorly. The peritoneum was identified and entered. The peritoneal incision was extended longitudinally. The uterus was noted to be dehisced at the previous incision and the amniotic sac was presenting as the peritoneum was entered.  The amniotic sac was ruptured and twin A presented.  Delivered from cephalic presentation was a 2790 gram living newborn female infant(s). APGAR (1 MIN): 6 and ( 5 MIN ) : 8.  The amniotic sac of baby B was then ruptured and twin B presented and a 2869 gram living female infant was delivered from cephalic position at 1916.  Apgar ( 1 MIN ) : 7 and ( 5 MIN ) : [address]8.   Crawford GivensHall, BoyA [place] [[id number]][[id number]]  [address]6   [place], BoyB [place] [[id number]][[id number]]  7   APGAR (5 MINS):    Haynes KernsHall, BoyA [place] [[id number]][[id number]]  8   Rikki SpearingHall, BoyB [place] [[id number]][[id number]]  8   APGAR (10 MINS):    Haynes KernsHall, BoyA [place] [[id number]][[id number]]    Rikki SpearingHall, BoyB [place] [[id number]][[id number]]       A cord ph was not sent.  The umbilical cord was clamped and cut cord. A sample was obtained for evaluation. The placenta was removed Intact and appeared normal.  The uterine incision was closed with running locked sutures of 1-0 Monocryl. A second imbricating layer of the same suture was placed.  Hemostasis was observed. There were multiple thick adhesions between the uterus and the anterior and lateral pelvic peritoneal walls and the fallopian tubes could not be reached safely.  A decision was made to not proceed any further with tubal sterilization.  The fascia was then reapproximated with running sutures of 0 Vicryl.  The skin was closed with staples.  Instrument, sponge, and needle counts were correct prior the abdominal closure and were correct at the conclusion of the case.    Findings:  Dehiscence of uterine scar.  Multiple adhesions between the uterine serosa and anterior and lateral pelvic peritoneum that prevented doing a tubal ligation.   Estimated Blood Loss: 1100  Total IV Fluids: 3300ml   Urine Output: 300CC OF clear urine  Specimens:  Placenta  Complications: no complications  Disposition: PACU - hemodynamically stable.  Maternal Condition: stable   Baby condition / location:  Couplet care / Skin to Skin    Signed: Surgeon(s): Brock Badharles A Lamesha Tibbits, MD Kathreen CosierBernard A Marshall, MD

## 2014-04-05 LAB — CBC
HEMATOCRIT: 23.1 % — AB (ref 36.0–46.0)
HEMOGLOBIN: 7.4 g/dL — AB (ref 12.0–15.0)
MCH: 26.5 pg (ref 26.0–34.0)
MCHC: 32 g/dL (ref 30.0–36.0)
MCV: 82.8 fL (ref 78.0–100.0)
Platelets: 131 10*3/uL — ABNORMAL LOW (ref 150–400)
RBC: 2.79 MIL/uL — ABNORMAL LOW (ref 3.87–5.11)
RDW: 15 % (ref 11.5–15.5)
WBC: 12.5 10*3/uL — ABNORMAL HIGH (ref 4.0–10.5)

## 2014-04-05 LAB — RPR

## 2014-04-05 NOTE — Anesthesia Postprocedure Evaluation (Signed)
  Anesthesia Post-op Note  Patient: Tiajuana Amassonetta R Mordan  Procedure(s) Performed: Procedure(s): CESAREAN SECTION (N/A)  Patient Location: Mother/Baby  Anesthesia Type:Spinal and Epidural  Level of Consciousness: awake, alert  and oriented  Airway and Oxygen Therapy: Patient Spontanous Breathing  Post-op Pain: mild  Post-op Assessment: Patient's Cardiovascular Status Stable, Respiratory Function Stable, Patent Airway, No signs of Nausea or vomiting, Adequate PO intake, Pain level controlled, No headache, No backache, No residual numbness and No residual motor weakness  Post-op Vital Signs: stable  Last Vitals:  Filed Vitals:   04/05/14 0843  BP: 104/53  Pulse: 68  Temp: 37.1 C  Resp: 18    Complications: No apparent anesthesia complications

## 2014-04-05 NOTE — Progress Notes (Signed)
Patient ID: Dorothy Lopez, female   DOB: May 19, 1981, 33 y.o.   MRN: [id number] Postop day 1 Vital signs normal hemoglobin 7+ A dressing dry Legs negative doing well

## 2014-04-05 NOTE — Addendum Note (Signed)
Addendum created 04/05/14 1013 by Lincoln BrighamAngela Draughon Gerre Ranum, CRNA   Modules edited: Notes Section   Notes Section:  File: [id number]

## 2014-04-06 ENCOUNTER — Encounter (HOSPITAL_COMMUNITY): Payer: Self-pay | Admitting: Anesthesiology

## 2014-04-06 NOTE — Progress Notes (Signed)
Patient ID: Dorothy Lopez, female   DOB: 1981/05/26, 33 y.o.   MRN: [id number] Postop day 2 Vital signs normal Incision clean and dry Legs negative Doing well

## 2014-04-07 ENCOUNTER — Encounter (HOSPITAL_COMMUNITY): Payer: Self-pay | Admitting: Obstetrics

## 2014-04-07 NOTE — Discharge Instructions (Signed)
Discharge instructions   You can wash your hair  Shower  Eat what you want  Drink what you want  See me in 6 weeks  Your ankles are going to swell more in the next 2 weeks than when pregnant  No sex for 6 weeks   Kathreen CosierBernard A Fabienne Nolasco, MD 04/07/2014

## 2014-04-07 NOTE — Progress Notes (Signed)
Ur chart review completed.  

## 2014-04-07 NOTE — Discharge Summary (Signed)
Obstetric Discharge Summary Reason for Admission: cesarean section Prenatal Procedures: none Intrapartum Procedures: cesarean: low cervical, transverse Postpartum Procedures: none Complications-Operative and Postpartum: none Hemoglobin  Date Value Ref Range Status  04/05/2014 7.4* 12.0 - 15.0 g/dL Final     DELTA CHECK NOTED     REPEATED TO VERIFY     HCT  Date Value Ref Range Status  04/05/2014 23.1* 36.0 - 46.0 % Final    Physical Exam:  General: alert Lochia: appropriate Uterine Fundus: firm Incision: healing well DVT Evaluation: No evidence of DVT seen on physical exam.  Discharge Diagnoses: Term Pregnancy-delivered  Discharge Information: Date: 04/07/2014 Activity: pelvic rest Diet: routine Medications: Percocet Condition: improved Instructions: refer to practice specific booklet Discharge to: home Follow-up Information   Follow up with HARPER,CHARLES A, MD.   Specialty:  Obstetrics and Gynecology   Contact information:   [address] [place] [address] [phone]       Newborn Data:   Crawford GivensHall, BoyA [place]ne [[id number]][[id number]]  Live born female  Birth Weight: 6 lb 2.4 oz (2790 g) APGAR: 6, 8   Soyla MurphyHall, BoyB [place] [[id number]][[id number]]  Live born female  Birth Weight: 6 lb 5.2 oz (2870 g) APGAR: 7, 8  Home with mother.  Kathreen CosierBernard A Jearlean Demauro 04/07/2014, 6:57 AM

## 2014-04-08 ENCOUNTER — Encounter: Payer: Medicaid Other | Admitting: Obstetrics

## 2014-04-08 LAB — TYPE AND SCREEN
ABO/RH(D): O POS
ANTIBODY SCREEN: NEGATIVE
UNIT DIVISION: 0
Unit division: 0

## 2014-04-09 ENCOUNTER — Ambulatory Visit (INDEPENDENT_AMBULATORY_CARE_PROVIDER_SITE_OTHER): Payer: Medicaid Other | Admitting: Obstetrics

## 2014-04-09 ENCOUNTER — Encounter: Payer: Self-pay | Admitting: Obstetrics

## 2014-04-09 DIAGNOSIS — Z3009 Encounter for other general counseling and advice on contraception: Secondary | ICD-10-CM

## 2014-04-09 MED ORDER — MEDROXYPROGESTERONE ACETATE 150 MG/ML IM SUSP: 150.0000 mg | INTRAMUSCULAR | Status: DC

## 2014-04-10 ENCOUNTER — Encounter: Payer: Self-pay | Admitting: Obstetrics

## 2014-04-10 ENCOUNTER — Ambulatory Visit (INDEPENDENT_AMBULATORY_CARE_PROVIDER_SITE_OTHER): Payer: Medicaid Other | Admitting: Obstetrics

## 2014-04-10 ENCOUNTER — Inpatient Hospital Stay (HOSPITAL_COMMUNITY): Admission: RE | Admit: 2014-04-10 | Payer: Medicaid Other | Source: Ambulatory Visit

## 2014-04-10 VITALS — BP 126/81 | HR 75 | Temp 98.4°F | Wt 158.0 lb

## 2014-04-10 DIAGNOSIS — IMO0001 Reserved for inherently not codable concepts without codable children: Secondary | ICD-10-CM

## 2014-04-10 DIAGNOSIS — Z309 Encounter for contraceptive management, unspecified: Secondary | ICD-10-CM

## 2014-04-10 MED ORDER — MEDROXYPROGESTERONE ACETATE 150 MG/ML IM SUSP
150.0000 mg | INTRAMUSCULAR | Status: AC
  Administered 2014-04-10 – 2014-07-03 (×2): 150 mg via INTRAMUSCULAR

## 2014-04-10 NOTE — Progress Notes (Signed)
Subjective:     Dorothy Lopez is a 33 y.o. female who presents for a postpartum visit. She is 1 week postpartum following a low cervical transverse Cesarean section. I have fully reviewed the prenatal and intrapartum course. The delivery was at 36.5 gestational weeks. Outcome: repeat cesarean section, low transverse incision. Anesthesia: spinal. Postpartum course has been normal. Baby's course has been normal. Baby is feeding by breast. Bleeding thin lochia. Bowel function is normal. Bladder function is normal. Patient is not sexually active. Contraception method is abstinence. Postpartum depression screening: negative.  The following portions of the patient's history were reviewed and updated as appropriate: allergies, current medications, past family history, past medical history, past social history, past surgical history and problem list.  Review of Systems Gastrointestinal: positive for abdominal pain Genitourinary:positive for pain in incision   Objective:    BP 116/76  Pulse 73  Temp(Src) 98.7 F (37.1 C)  Ht 5' (1.524 m)  Wt 160 lb (72.576 kg)  BMI 31.25 kg/m2  Breastfeeding? No  General:  alert and no distress   Breasts:  inspection negative, no nipple discharge or bleeding, no masses or nodularity palpable  Lungs: clear to auscultation bilaterally  Heart:  regular rate and rhythm, S1, S2 normal, no murmur, click, rub or gallop  Abdomen: abnormal findings:  moderate tenderness in the lower abdomen and incision, appropriate post op. Incision C, D, I.  Staples removed and steri strips applied.                          Assessment:    POD#5.  Doing well after repeat C/S.    Plan:    1. Contraception: Depo-Provera injections 2. Depo Provera Rx 3. Follow up in: 2 weeks or as needed.

## 2014-04-10 NOTE — Progress Notes (Deleted)
postpartum

## 2014-04-10 NOTE — Progress Notes (Signed)
Patient is in the office for incision check. Dr Clearance CootsHarper ordered Depo Provera to be given. Patient has been on it in the past and she was amenorrheic on the injection. Discussed abnormal bleeding with restart of Depo Provera. BP 126/81  Pulse 75  Temp(Src) 98.4 F (36.9 C)  Wt 158 lb (71.668 kg)  Breastfeeding? No  Administrations This Visit   medroxyPROGESTERone (DEPO-PROVERA) injection 150 mg   Administered Action Dose Route Administered By   04/10/2014 Given 150 mg Intramuscular Elson AreasJane F Paul, RN

## 2014-04-11 ENCOUNTER — Inpatient Hospital Stay (HOSPITAL_COMMUNITY): Admission: RE | Admit: 2014-04-11 | Payer: Medicaid Other | Source: Ambulatory Visit | Admitting: Obstetrics

## 2014-04-11 ENCOUNTER — Encounter (HOSPITAL_COMMUNITY): Admission: RE | Payer: Self-pay | Source: Ambulatory Visit

## 2014-04-11 ENCOUNTER — Encounter: Payer: Self-pay | Admitting: Obstetrics

## 2014-04-11 SURGERY — Surgical Case
Anesthesia: Regional

## 2014-04-21 ENCOUNTER — Encounter: Payer: Self-pay | Admitting: Obstetrics

## 2014-04-21 ENCOUNTER — Ambulatory Visit (INDEPENDENT_AMBULATORY_CARE_PROVIDER_SITE_OTHER): Payer: Medicaid Other | Admitting: Obstetrics

## 2014-04-21 VITALS — BP 119/79 | HR 73 | Temp 98.5°F | Ht 60.0 in | Wt 143.0 lb

## 2014-04-21 DIAGNOSIS — R52 Pain, unspecified: Secondary | ICD-10-CM

## 2014-04-21 DIAGNOSIS — O900 Disruption of cesarean delivery wound: Secondary | ICD-10-CM

## 2014-04-21 MED ORDER — IBUPROFEN 800 MG PO TABS: 800.0000 mg | ORAL_TABLET | Freq: Three times a day (TID) | ORAL | Status: DC | PRN

## 2014-04-21 MED ORDER — OXYCODONE HCL 10 MG PO TABS: 10.0000 mg | ORAL_TABLET | Freq: Four times a day (QID) | ORAL | Status: DC | PRN

## 2014-04-21 MED ORDER — AMOXICILLIN-POT CLAVULANATE 875-125 MG PO TABS: 1.0000 | ORAL_TABLET | Freq: Two times a day (BID) | ORAL | Status: DC

## 2014-04-21 NOTE — Progress Notes (Signed)
Subjective:     Dorothy Lopez is a 33 y.o. female who presents for a postpartum visit. She is 2 weeks postpartum following a low cervical transverse Cesarean section. I have fully reviewed the prenatal and intrapartum course. The delivery was at 36.5 gestational weeks. Outcome: repeat cesarean section, low transverse incision for twins.  Anesthesia: spinal. Postpartum course has been complicated by wound separation over the past week.   Twins' courses has been normal. Baby is feeding by both breast and bottle - Gerber. Bleeding thin lochia. Bowel function is normal. Bladder function is normal. Patient is not sexually active. Contraception method is abstinence. Postpartum depression screening: negative.  The following portions of the patient's history were reviewed and updated as appropriate: allergies, current medications, past family history, past medical history, past social history, past surgical history and problem list.  Review of Systems A comprehensive review of systems was negative except for: Genitourinary: positive for C/S wound separation, superficial.   Objective:    BP 119/79  Pulse 73  Temp(Src) 98.5 F (36.9 C)  Ht 5' (1.524 m)  Wt 143 lb (64.864 kg)  BMI 27.93 kg/m2  Breastfeeding? No  General:  alert and no distress  Breast:  Negative Abdomen:  ~ 4 cm superficial skin separation of C/S incision, pink with small amount of exudate.  The wound cleansed with half strength peroxide and a sterile bandage applied.      Assessment:    Postpartum superficial wound separation.  Stable.  Will apply for the Wound Vac System for assistance in healing.  Plan:   Wound management with Wound Vac System.

## 2014-04-22 ENCOUNTER — Ambulatory Visit: Payer: Medicaid Other | Admitting: Obstetrics

## 2014-04-23 ENCOUNTER — Ambulatory Visit (INDEPENDENT_AMBULATORY_CARE_PROVIDER_SITE_OTHER): Payer: Medicaid Other | Admitting: Obstetrics

## 2014-04-23 ENCOUNTER — Encounter: Payer: Self-pay | Admitting: Obstetrics

## 2014-04-23 VITALS — BP 118/78 | HR 76 | Temp 97.9°F | Ht 60.0 in | Wt 144.0 lb

## 2014-04-23 DIAGNOSIS — O900 Disruption of cesarean delivery wound: Secondary | ICD-10-CM

## 2014-04-23 NOTE — Progress Notes (Signed)
Subjective:     Dorothy Lopez is a 33 y.o. female who presents for a postpartum visit. She is 2 weeks postpartum following a low cervical transverse Cesarean section. I have fully reviewed the prenatal and intrapartum course. The delivery was at 37 gestational weeks. Outcome: repeat cesarean section, low transverse incision. Anesthesia: spinal. Postpartum course has been complicated by superficial wound separation at the skin. Baby's course has been normal. Baby is feeding by bottle - Similac Neosure. Bleeding thin lochia. Bowel function is normal. Bladder function is normal.  Review of Systems Pertinent items are noted in HPI.   Objective:    BP 118/78  Pulse 76  Temp(Src) 97.9 F (36.6 C)  Ht 5' (1.524 m)  Wt 144 lb (65.318 kg)  BMI 28.12 kg/m2  Breastfeeding? No  General:  alert and no distress Abdomen:  Soft, NT.  Incision clean.  Bandage changed.     Assessment:    S/P C/S.  Superficial wound infection / separation.  Healing well with daily dressing changes.  Plan:    1. Contraception: abstinence 2. Continue daily dressing changes. 3. Follow up in: 2 weeks or as needed.

## 2014-04-24 ENCOUNTER — Encounter: Payer: Self-pay | Admitting: Obstetrics

## 2014-05-19 ENCOUNTER — Ambulatory Visit: Payer: Medicaid Other | Admitting: Obstetrics

## 2014-05-19 ENCOUNTER — Encounter: Payer: Self-pay | Admitting: Obstetrics

## 2014-05-19 ENCOUNTER — Ambulatory Visit (INDEPENDENT_AMBULATORY_CARE_PROVIDER_SITE_OTHER): Payer: Medicaid Other | Admitting: Obstetrics

## 2014-05-19 DIAGNOSIS — Z3009 Encounter for other general counseling and advice on contraception: Secondary | ICD-10-CM

## 2014-05-20 ENCOUNTER — Encounter: Payer: Self-pay | Admitting: Obstetrics

## 2014-05-20 NOTE — Progress Notes (Signed)
Subjective:     Dorothy Lopez is a 33 y.o. female who presents for a postpartum visit. She is 6 weeks postpartum following a low cervical transverse Cesarean section. I have fully reviewed the prenatal and intrapartum course. The delivery was at 36.5 gestational weeks. Outcome: repeat cesarean section, low transverse incision. Anesthesia: spinal. Postpartum course has been complicated by C/S wound infection. Baby's course has been normal. Baby is feeding by bottle Rush Barer. Bleeding no bleeding. Bowel function is normal. Bladder function is normal. Patient is not sexually active. Contraception method is abstinence and Depo Provera. Postpartum depression screening: negative.  The following portions of the patient's history were reviewed and updated as appropriate: allergies, current medications, past family history, past medical history, past social history, past surgical history and problem list.  Review of Systems A comprehensive review of systems was negative.   Objective:    BP 124/81  Pulse 77  Temp(Src) 98.5 F (36.9 C)  Ht 5' (1.524 m)  Wt 147 lb (66.679 kg)  BMI 28.71 kg/m2  Breastfeeding? No  General:  alert and no distress  PE:     Breasts:  Negative    Abdomen:  Soft, NT.  Incision C, D, I.  Small area of granulation                              Tissue cauterized with silver nitrate stick.                               Assessment:     Normal postpartum exam. Pap smear not done at today's visit.   Plan:    1. Contraception: Considering options.  Currently Depo Provera. 2. Continue PNV's 3. Follow up in: 6 weeks or as needed.

## 2014-06-03 ENCOUNTER — Encounter: Payer: Self-pay | Admitting: Obstetrics

## 2014-06-03 ENCOUNTER — Ambulatory Visit (INDEPENDENT_AMBULATORY_CARE_PROVIDER_SITE_OTHER): Payer: Medicaid Other | Admitting: Obstetrics

## 2014-06-03 NOTE — Progress Notes (Signed)
Subjective:     Dorothy Lopez is a 33 y.o. female who presents for a postpartum visit. She is 6 weeks postpartum following a low cervical transverse Cesarean section. I have fully reviewed the prenatal and intrapartum course. The delivery was at 36 gestational weeks. Outcome: repeat cesarean section, low transverse incision. Anesthesia: spinal. Postpartum course has been normal. Baby's course has been normal. Baby is feeding by both breast and bottle Rush Barer- Gerber. Bleeding no bleeding. Bowel function is normal. Bladder function is normal. Patient is not sexually active. Contraception method is Depo-Provera injections. Postpartum depression screening: negative.  The following portions of the patient's history were reviewed and updated as appropriate: allergies, current medications, past family history, past medical history, past social history, past surgical history and problem list.  Review of Systems A comprehensive review of systems was negative.   Objective:    BP 116/80  Pulse 89  Wt 144 lb (65.318 kg)  General:  alert and no distress PE:  Breasts:  Negative         Abdomen:  Incision C, D, I.  Nontender.         Pelvic:  NEFG.                        Uterus NSSC, NT.                      Adnexa negative     Assessment:     Normal postpartum exam. Pap smear not done at today's visit.   Plan:    1. Contraception: Depo-Provera injections 2. Continue PNV's 3. Follow up in:  Annual

## 2014-07-02 ENCOUNTER — Ambulatory Visit: Payer: Medicaid Other

## 2014-07-03 ENCOUNTER — Ambulatory Visit (INDEPENDENT_AMBULATORY_CARE_PROVIDER_SITE_OTHER): Payer: Medicaid Other | Admitting: *Deleted

## 2014-07-03 VITALS — BP 115/77 | HR 74 | Temp 98.0°F | Ht 60.0 in | Wt 149.0 lb

## 2014-07-03 DIAGNOSIS — Z3042 Encounter for surveillance of injectable contraceptive: Secondary | ICD-10-CM

## 2014-07-03 DIAGNOSIS — Z3049 Encounter for surveillance of other contraceptives: Secondary | ICD-10-CM

## 2014-07-03 NOTE — Progress Notes (Signed)
Patient in office for a Depo Injection. Patient is on time for her injection.  Patient tolerated injection well.  Patient due for next injection 09-23-14   BP 115/77  Pulse 74  Temp(Src) 98 F (36.7 C)  Ht 5' (1.524 m)  Wt 149 lb (67.586 kg)  BMI 29.10 kg/m2  Breastfeeding? No Administrations This Visit   medroxyPROGESTERone (DEPO-PROVERA) injection 150 mg   Administered Action Dose Route Administered By   07/03/2014 Given 150 mg Intramuscular Shelda PalAndrea Lynn Liviana Mills, LPN

## 2014-08-18 IMAGING — US US OB LIMITED
1 series · 13 of 28 positions shown · non-contrast
Comparison: none

[Series 1: us ob limited · 0.21mm/px · 13 of 28 slices shown]
[im 2/28]
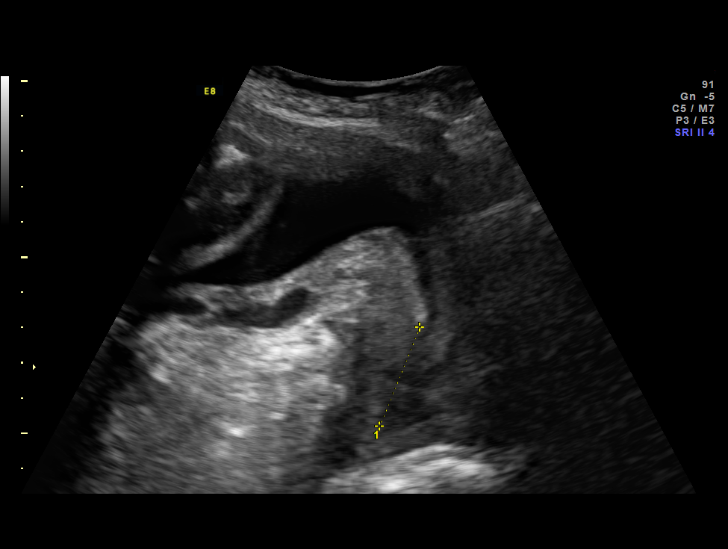
[im 4/28]
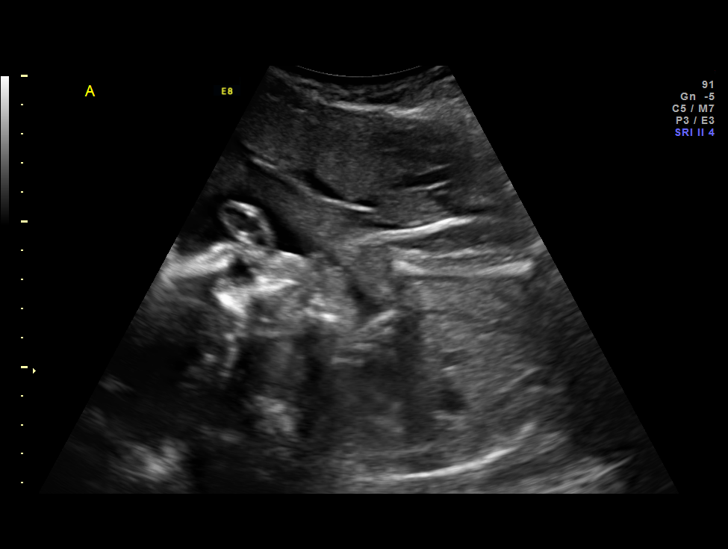
[im 6/28]
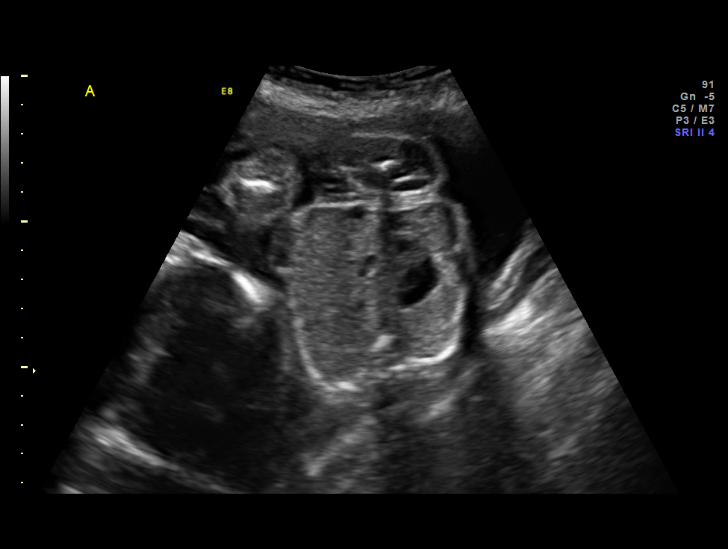
[im 8/28]
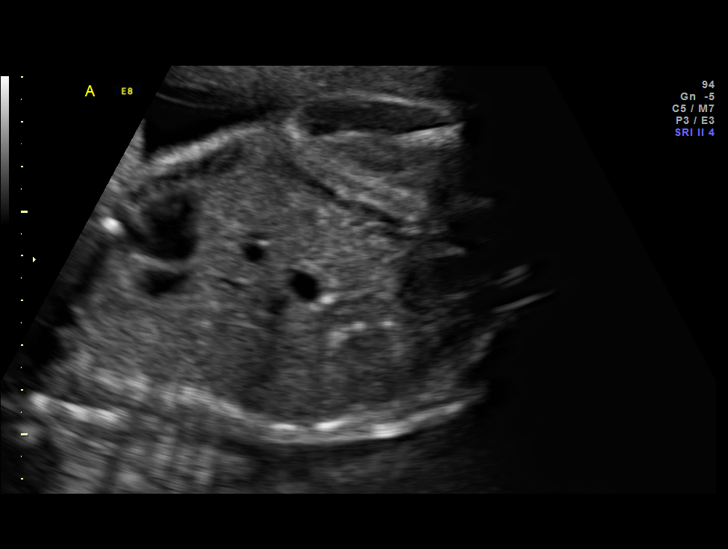
[im 10/28]
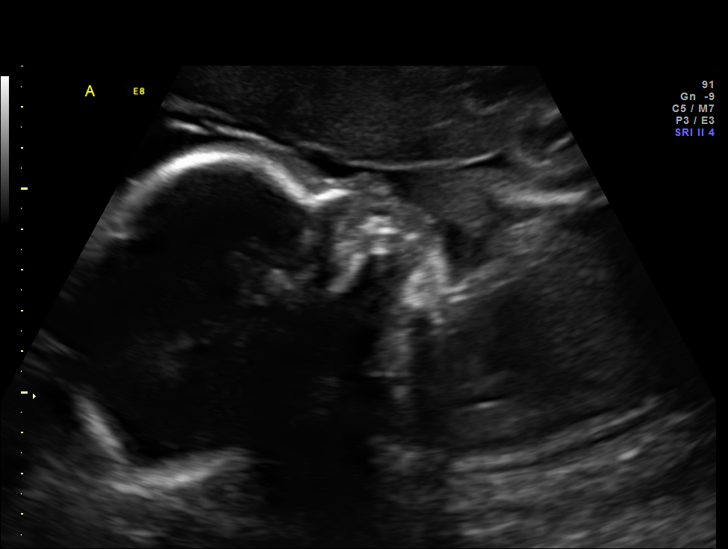
[im 12/28]
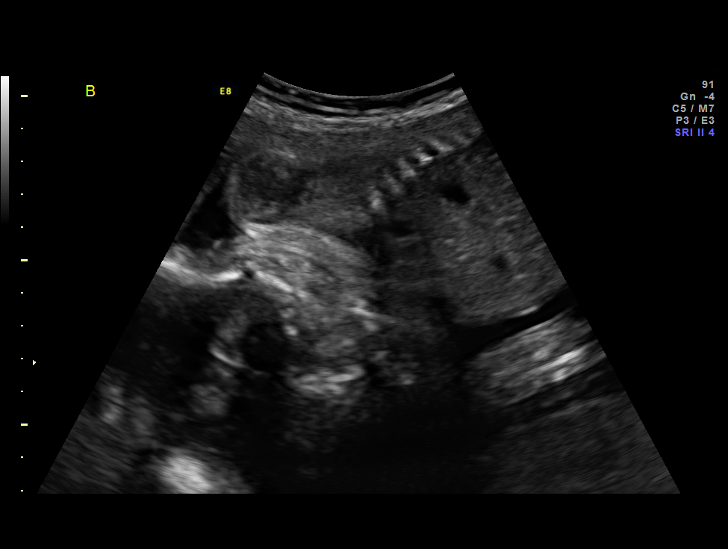
[im 15/28]
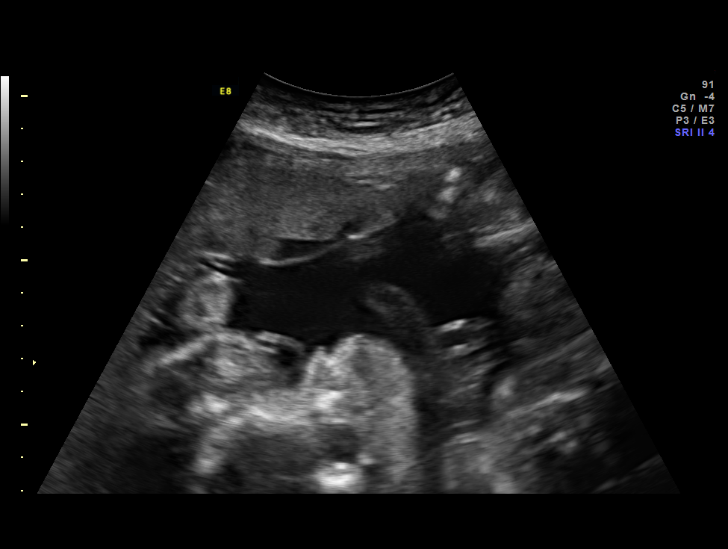
[im 17/28]
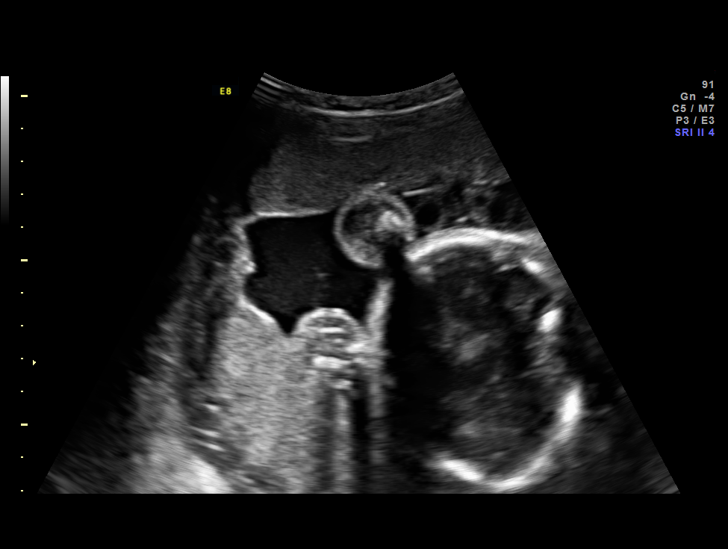
[im 19/28]
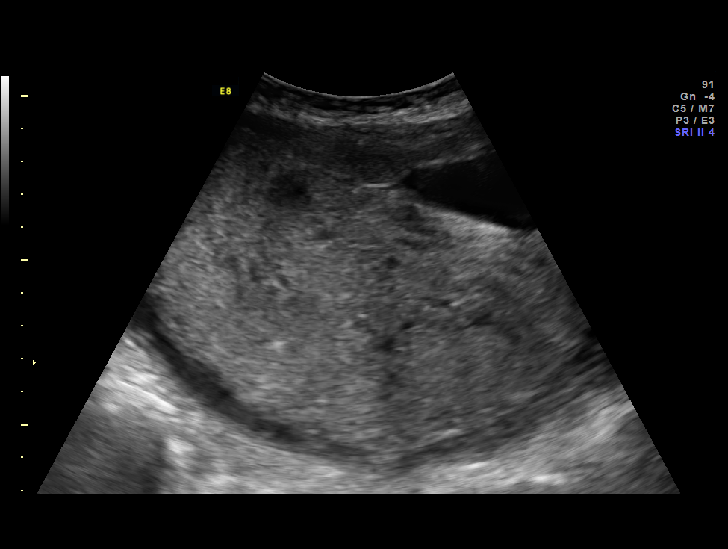
[im 21/28]
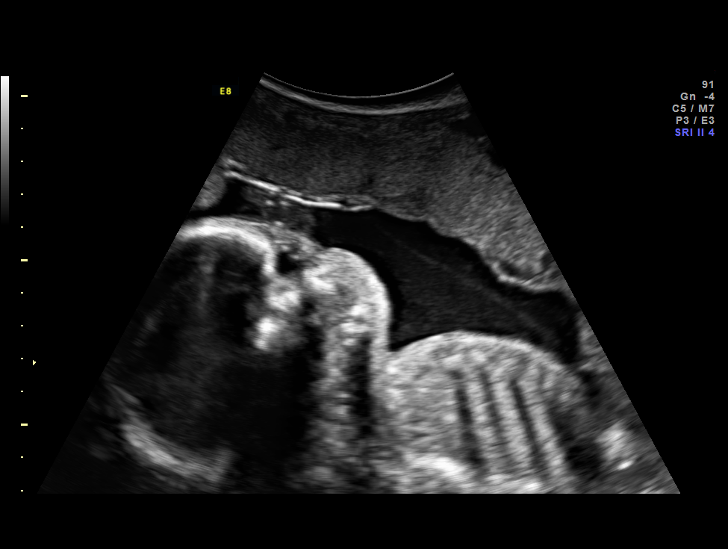
[im 23/28]
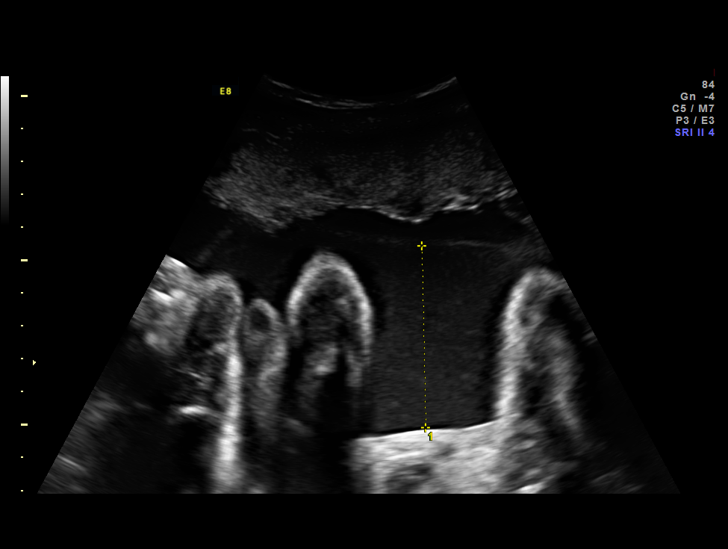
[im 25/28]
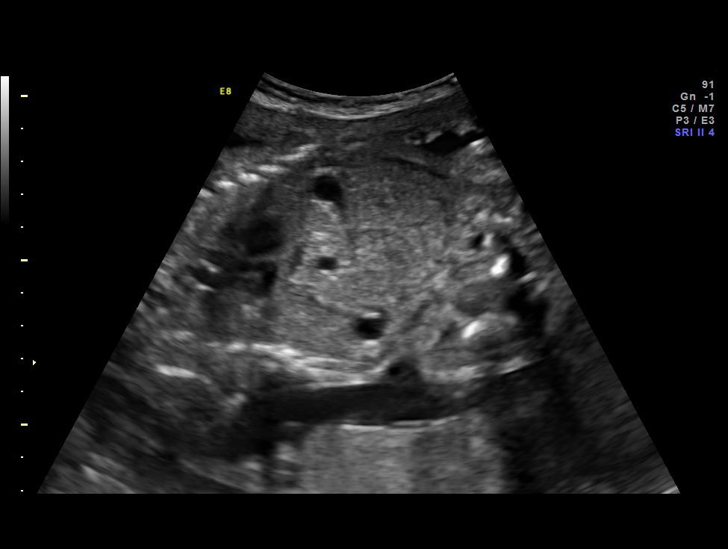
[im 27/28]
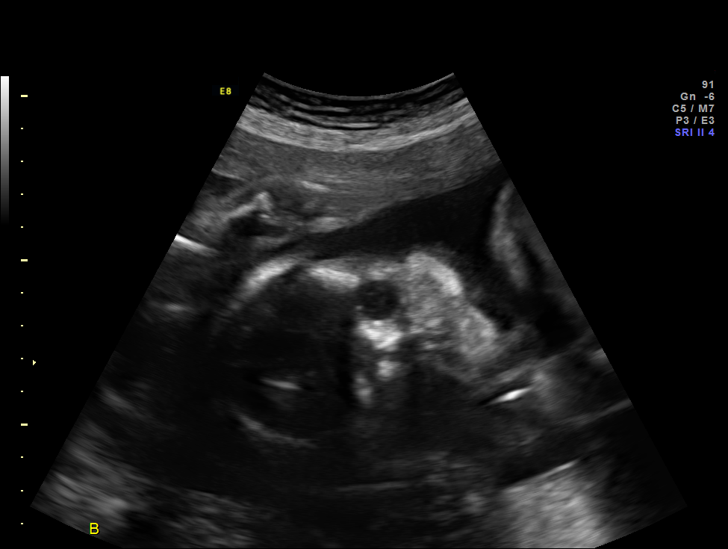

[13 of 28 positions shown; findings below may reference images not displayed]

OBSTETRICS REPORT
                      (Signed Final 01/29/2014 [DATE])

Service(s) Provided

 [HOSPITAL]                                         76815.0
Indications

 Twin gestation, Ar
 Previous cesarean section x 3
Fetal Evaluation (Fetus A)

 Num Of Fetuses:    2
 Fetal Heart Rate:  139                          bpm
 Cardiac Activity:  Observed
 Fetal Lie:         Lower left Fetus
 Presentation:      Transverse, head to
                    maternal right
 Placenta:          Posterior left, above
                    cervical os
 P. Cord            Previously Visualized
 Insertion:

 Membrane Desc:     Dividing
                    Membrane seen
                    - Monochorionic

 Amniotic Fluid
 AFI FV:      Subjectively within normal limits
                                             Larg Pckt:     5.5  cm
Gestational Age (Fetus A)

 LMP:           27w 3d        Date:  07/21/13                 EDD:   04/27/14
 Best:          27w 3d     Det. By:  LMP  (07/21/13)          EDD:   04/27/14

Fetal Evaluation (Fetus B)

 Num Of Fetuses:    2
 Fetal Heart Rate:  150                          bpm
 Cardiac Activity:  Observed
 Fetal Lie:         Upper Anterior Fetus
 Presentation:      Transverse, head to
                    maternal right
 Placenta:          Posterior left, above
                    cervical os
 P. Cord            Previously Visualized
 Insertion:
 Membrane Desc:     Dividing
                    Membrane seen
                    - Monochorionic

 Amniotic Fluid
 AFI FV:      Subjectively within normal limits
                                             Larg Pckt:     5.4  cm
Gestational Age (Fetus B)

 LMP:           27w 3d        Date:  07/21/13                 EDD:   04/27/14
 Best:          27w 3d     Det. By:  LMP  (07/21/13)          EDD:   04/27/14
Cervix Uterus Adnexa

 Cervical Length:    3        cm

 Cervix:       Normal appearance by transabdominal scan. Appears
               closed, without funnelling.
Impression

 Monochorionic/diamniotic twin pregnancy at 27+3 weeks
 Normal amniotic fluid volume x 2
 Stomach and bladder seen x 2
Recommendations

 Growth in 2 weeks
 Fluid check in 4 weeks
 Recommend delivery 
 36 weeks

 questions or concerns.

## 2014-09-23 ENCOUNTER — Ambulatory Visit: Payer: Medicaid Other

## 2014-12-15 ENCOUNTER — Encounter: Payer: Self-pay | Admitting: *Deleted

## 2015-10-18 ENCOUNTER — Encounter (HOSPITAL_COMMUNITY): Payer: Self-pay | Admitting: *Deleted

## 2015-10-18 ENCOUNTER — Emergency Department (HOSPITAL_COMMUNITY)
Admission: EM | Admit: 2015-10-18 | Discharge: 2015-10-18 | Disposition: A | Discharge status: Home / Self Care | Payer: Medicaid Other | Attending: Emergency Medicine | Admitting: Emergency Medicine

## 2015-10-18 ENCOUNTER — Emergency Department (HOSPITAL_COMMUNITY): Payer: Medicaid Other

## 2015-10-18 DIAGNOSIS — N719 Inflammatory disease of uterus, unspecified: Secondary | ICD-10-CM

## 2015-10-18 DIAGNOSIS — N3090 Cystitis, unspecified without hematuria: Secondary | ICD-10-CM

## 2015-10-18 DIAGNOSIS — R1110 Vomiting, unspecified: Secondary | ICD-10-CM | POA: Insufficient documentation

## 2015-10-18 DIAGNOSIS — Z3202 Encounter for pregnancy test, result negative: Secondary | ICD-10-CM | POA: Insufficient documentation

## 2015-10-18 DIAGNOSIS — Z79899 Other long term (current) drug therapy: Secondary | ICD-10-CM | POA: Insufficient documentation

## 2015-10-18 LAB — COMPREHENSIVE METABOLIC PANEL
ALBUMIN: 3.1 g/dL — AB (ref 3.5–5.0)
ALT: 24 U/L (ref 14–54)
AST: 23 U/L (ref 15–41)
Alkaline Phosphatase: 57 U/L (ref 38–126)
Anion gap: 7 (ref 5–15)
BUN: 8 mg/dL (ref 6–20)
CHLORIDE: 104 mmol/L (ref 101–111)
CO2: 25 mmol/L (ref 22–32)
CREATININE: 0.75 mg/dL (ref 0.44–1.00)
Calcium: 9.2 mg/dL (ref 8.9–10.3)
GFR calc Af Amer: >60 mL/min (ref >60)
GLUCOSE: 92 mg/dL (ref 65–99)
Potassium: 3.7 mmol/L (ref 3.5–5.1)
Sodium: 136 mmol/L (ref 135–145)
Total Bilirubin: 0.5 mg/dL (ref 0.3–1.2)
Total Protein: 7.4 g/dL (ref 6.5–8.1)

## 2015-10-18 LAB — CBC
HEMATOCRIT: 28.3 % — AB (ref 36.0–46.0)
Hemoglobin: 8.4 g/dL — ABNORMAL LOW (ref 12.0–15.0)
MCH: 22.3 pg — AB (ref 26.0–34.0)
MCHC: 29.7 g/dL — AB (ref 30.0–36.0)
MCV: 75.3 fL — AB (ref 78.0–100.0)
PLATELETS: 290 10*3/uL (ref 150–400)
RBC: 3.76 MIL/uL — ABNORMAL LOW (ref 3.87–5.11)
RDW: 14.8 % (ref 11.5–15.5)
WBC: 10.2 10*3/uL (ref 4.0–10.5)

## 2015-10-18 LAB — URINALYSIS, ROUTINE W REFLEX MICROSCOPIC
BILIRUBIN URINE: NEGATIVE
GLUCOSE, UA: NEGATIVE mg/dL
KETONES UR: NEGATIVE mg/dL
Nitrite: POSITIVE — AB
PH: 7 (ref 5.0–8.0)
Protein, ur: NEGATIVE mg/dL
SPECIFIC GRAVITY, URINE: 1.022 (ref 1.005–1.030)
Urobilinogen, UA: 0.2 mg/dL (ref 0.0–1.0)

## 2015-10-18 LAB — URINE MICROSCOPIC-ADD ON

## 2015-10-18 LAB — I-STAT BETA HCG BLOOD, ED (MC, WL, AP ONLY): I-stat hCG, quantitative: <5 m[IU]/mL (ref <5)

## 2015-10-18 MED ORDER — ONDANSETRON HCL 4 MG/2ML IJ SOLN
4.0000 mg | Freq: Once | INTRAMUSCULAR | Status: AC
  Administered 2015-10-18: 4 mg via INTRAVENOUS
  Filled 2015-10-18: qty 2

## 2015-10-18 MED ORDER — FENTANYL CITRATE (PF) 100 MCG/2ML IJ SOLN
50.0000 ug | Freq: Once | INTRAMUSCULAR | Status: AC
  Administered 2015-10-18: 50 ug via INTRAVENOUS
  Filled 2015-10-18: qty 2

## 2015-10-18 MED ORDER — CEFTRIAXONE SODIUM 250 MG IJ SOLR
250.0000 mg | Freq: Once | INTRAMUSCULAR | Status: AC
  Administered 2015-10-18: 250 mg via INTRAMUSCULAR
  Filled 2015-10-18: qty 250

## 2015-10-18 MED ORDER — HYDROCODONE-ACETAMINOPHEN 5-325 MG PO TABS: 1.0000 | ORAL_TABLET | Freq: Four times a day (QID) | ORAL | Status: DC | PRN

## 2015-10-18 MED ORDER — DOXYCYCLINE HYCLATE 100 MG PO TABS
100.0000 mg | ORAL_TABLET | Freq: Once | ORAL | Status: AC
  Administered 2015-10-18: 100 mg via ORAL
  Filled 2015-10-18: qty 1

## 2015-10-18 MED ORDER — LIDOCAINE HCL (PF) 1 % IJ SOLN
1.0000 mL | Freq: Once | INTRAMUSCULAR | Status: AC
  Administered 2015-10-18: 1 mL
  Filled 2015-10-18: qty 5

## 2015-10-18 MED ORDER — SODIUM CHLORIDE 0.9 % IV BOLUS (SEPSIS)
1000.0000 mL | Freq: Once | INTRAVENOUS | Status: AC
  Administered 2015-10-18: 1000 mL via INTRAVENOUS

## 2015-10-18 MED ORDER — DOXYCYCLINE HYCLATE 100 MG PO TABS: 100.0000 mg | ORAL_TABLET | Freq: Two times a day (BID) | ORAL | Status: DC

## 2015-10-18 MED ORDER — IOHEXOL 300 MG/ML  SOLN
100.0000 mL | Freq: Once | INTRAMUSCULAR | Status: AC | PRN
  Administered 2015-10-18: 100 mL via INTRAVENOUS

## 2015-10-18 MED ORDER — FOSFOMYCIN TROMETHAMINE 3 G PO PACK
3.0000 g | PACK | Freq: Once | ORAL | Status: AC
  Administered 2015-10-18: 3 g via ORAL
  Filled 2015-10-18: qty 3

## 2015-10-18 NOTE — ED Notes (Signed)
Pt. Left with all belongings and refused wheelchair. Discharge instructions were reviewed and all questions were answered.  

## 2015-10-18 NOTE — ED Provider Notes (Signed)
CSN: [id number]     Arrival date & time 10/18/15  1529 History   First MD Initiated Contact with Patient 10/18/15 1754     Chief Complaint  Patient presents with  . Abdominal Pain     (Consider location/radiation/quality/duration/timing/severity/associated sxs/prior Treatment) HPI Reason presents with concern of ongoing nausea, vomiting, abdominal pain. Patient has been intermittent for several weeks, beginning soon after the patient had elective medication mediated abortion. The pain has been focally in the lower mid abdomen, nonradiating, sore, moderate to severe. Past days the pain has become persistent, more severe. There is now persistent nausea, with multiple episodes of vomiting as well. 24 hours ago, the patient had a fever 103. She gets transient relief with ibuprofen, Tylenol. She has a notable history of 4 prior C-section.  History reviewed. No pertinent past medical history. Past Surgical History  Procedure Laterality Date  . Cesarean section      x 3  . Cesarean section N/A 04/04/2014    Procedure: CESAREAN SECTION;  Surgeon: Brock Bad, MD;  Location: [facility];  Service: Obstetrics;  Laterality: N/A;   Family History  Problem Relation Age of Onset  . Diabetes Neg Hx   . Hypertension Neg Hx   . Cancer Neg Hx   . Heart disease Neg Hx   . Kidney disease Brother     deceased at age 40  . Kidney disease Maternal Grandmother    Social History  Substance Use Topics  . Smoking status: Never Smoker   . Smokeless tobacco: Never Used  . Alcohol Use: No   OB History    Gravida Para Term Preterm AB TAB SAB Ectopic Multiple Living   Review of Systems  Constitutional:       Per HPI, otherwise negative  HENT:       Per HPI, otherwise negative  Respiratory:       Per HPI, otherwise negative  Cardiovascular:       Per HPI, otherwise negative  Gastrointestinal: Positive for vomiting.  Endocrine:       Negative aside from HPI    Genitourinary:       Neg aside from HPI   Musculoskeletal:       Per HPI, otherwise negative  Skin: Negative.   Neurological: Negative for syncope.      Allergies  Review of patient's allergies indicates no known allergies.  Home Medications   Prior to Admission medications   Medication Sig Start Date End Date Taking? Authorizing Provider  ibuprofen (ADVIL,MOTRIN) 800 MG tablet Take 1 tablet (800 mg total) by mouth every 8 (eight) hours as needed. 04/21/14   Brock Bad, MD  medroxyPROGESTERone (DEPO-PROVERA) 150 MG/ML injection Inject 1 mL (150 mg total) into the muscle every 3 (three) months. 04/09/14   Brock Bad, MD  Prenatal Vit-Fe Fumarate-FA (PRENATAL MULTIVITAMIN) TABS tablet Take 1 tablet by mouth daily at 12 noon.    Historical Provider, MD   BP 114/60 mmHg  Pulse 95  Temp(Src) 99.2 F (37.3 C) (Oral)  Resp 18  SpO2 100% Physical Exam  Constitutional: She is oriented to person, place, and time. She appears well-developed and well-nourished. No distress.  HENT:  Head: Normocephalic and atraumatic.  Eyes: Conjunctivae and EOM are normal.  Cardiovascular: Normal rate and regular rhythm.   Pulmonary/Chest: Effort normal and breath sounds normal. No stridor. No respiratory distress.  Abdominal: She exhibits no distension. There  is tenderness in the right lower quadrant, periumbilical area, suprapubic area and left lower quadrant.    Musculoskeletal: She exhibits no edema.  Neurological: She is alert and oriented to person, place, and time. No cranial nerve deficit.  Skin: Skin is warm and dry.  Psychiatric: She has a normal mood and affect.  Nursing note and vitals reviewed.   ED Course  Procedures (including critical care time) Labs Review Labs Reviewed  COMPREHENSIVE METABOLIC PANEL - Abnormal; Notable for the following:    Albumin 3.1 (*)    All other components within normal limits  CBC - Abnormal; Notable for the following:    RBC 3.76 (*)     Hemoglobin 8.4 (*)    HCT 28.3 (*)    MCV 75.3 (*)    MCH 22.3 (*)    MCHC 29.7 (*)    All other components within normal limits  URINALYSIS, ROUTINE W REFLEX MICROSCOPIC (NOT AT [facility])  PREGNANCY, URINE    With initial concern for possible endometritis versus urinary tract infection, patient had labs, CT scan ordered.  Imaging Review Ct Abdomen Pelvis W Contrast  10/18/2015  CLINICAL DATA:  Lower abdominal pain. Fever. Vaginal bleeding. Post elective abortion. Rule out endometritis. EXAM: CT ABDOMEN AND PELVIS WITH CONTRAST TECHNIQUE: Multidetector CT imaging of the abdomen and pelvis was performed using the standard protocol following bolus administration of intravenous contrast. CONTRAST:  100mL OMNIPAQUE IOHEXOL 300 MG/ML  SOLN COMPARISON:  None. FINDINGS: Lower chest:  Lung bases are clear without infiltrate or effusion. Hepatobiliary: Normal liver.  Normal gallbladder and bile ducts. Pancreas: Normal Spleen: Normal Adrenals/Urinary Tract: Normal kidneys. No renal mass or obstruction. No urinary tract calculi. Urinary bladder normal. Stomach/Bowel: Negative for bowel obstruction.  No bowel edema. Vascular/Lymphatic: Normal aorta and IVC. Negative for lymphadenopathy. Reproductive: Enlarged uterus. Prominent endometrium measuring 17 mm in thickness. This could be due to endometrial abscess. Heterogeneous myometrial enhancement may be due to infection or small fibroids. Complex cyst left adnexa 3 x 4 cm may represent infection. Possible tubo-ovarian abscess versus complex ovarian cyst. Other: No free fluid. 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 No acute bony abnormality. IMPRESSION: Enlarged uterus with prominent endometrium and heterogeneous enhancement of the myometrium. Given the history, this may represent endometritis and infection. In addition there is a complex 3 x 4 cm cyst left adnexa which may be a  tubo-ovarian abscess. Electronically Signed   By: Marlan Palauharles  Clark M.D.   On: 10/18/2015 21:17   I have personally reviewed and evaluated these images and lab results as part of my medical decision-making.   As discussed, it is important that you follow up as soon as possible with your physician for continued management of your condition.  If you develop any new, or concerning changes in your condition, please return to the emergency department immediately.   On repeat exam patient is in no distress. I discussed all findings, with the patient and her husband, after discussing them with our obstetrics colleagues. Patient has no new complaints, pain is well-controlled. MDM  Patient presents with persistent lower abdominal pain, report of fever. With history of recent methotrexate mediated abortion, there is concern for endometrial inflammation. CT scan consistent with possible endometritis, likely cyst. Here the patient is afebrile, no leukocytosis, no peritonitis. After discussion with our cardiology colleagues, regarding appropriate therapy, follow-up, patient was discharged to follow-up in the clinic.  Gerhard Munchobert Radley Barto, MD 10/18/15 [phone]

## 2015-10-18 NOTE — ED Notes (Signed)
Pt is here intermittent lower abdominal pain for a couple of weeks.  Pt states last nite had a fever of 103.  Pt continues to have abdominal pain, lower back pain, and frequent urination.  Pt reports some vaginal bleeding but it is not her menstrual time.  Pt had a heavier menstrual earlier in the middle of the month.

## 2015-10-18 NOTE — Discharge Instructions (Signed)
As discussed, tonight's evaluation has demonstrated the presence of an infection.  Is very important to take all medication as directed, and be sure to follow-up with your gynecologist.  Return here for any concerning changes in your condition.

## 2016-07-19 HISTORY — PX: INDUCED ABORTION: SHX677

## 2016-07-29 ENCOUNTER — Inpatient Hospital Stay (HOSPITAL_COMMUNITY)
Admission: AD | Admit: 2016-07-29 | Discharge: 2016-07-29 | Disposition: A | Discharge status: Home / Self Care | Payer: Medicaid Other | Source: Ambulatory Visit | Attending: Family Medicine | Admitting: Family Medicine

## 2016-07-29 ENCOUNTER — Inpatient Hospital Stay (HOSPITAL_COMMUNITY): Payer: Medicaid Other

## 2016-07-29 ENCOUNTER — Encounter (HOSPITAL_COMMUNITY): Payer: Self-pay

## 2016-07-29 DIAGNOSIS — O26891 Other specified pregnancy related conditions, first trimester: Secondary | ICD-10-CM | POA: Insufficient documentation

## 2016-07-29 DIAGNOSIS — R109 Unspecified abdominal pain: Secondary | ICD-10-CM | POA: Diagnosis present

## 2016-07-29 DIAGNOSIS — Z3A01 Less than 8 weeks gestation of pregnancy: Secondary | ICD-10-CM | POA: Diagnosis not present

## 2016-07-29 DIAGNOSIS — O26899 Other specified pregnancy related conditions, unspecified trimester: Secondary | ICD-10-CM

## 2016-07-29 DIAGNOSIS — O23591 Infection of other part of genital tract in pregnancy, first trimester: Secondary | ICD-10-CM | POA: Diagnosis not present

## 2016-07-29 DIAGNOSIS — N760 Acute vaginitis: Secondary | ICD-10-CM | POA: Diagnosis not present

## 2016-07-29 DIAGNOSIS — B9689 Other specified bacterial agents as the cause of diseases classified elsewhere: Secondary | ICD-10-CM | POA: Diagnosis not present

## 2016-07-29 DIAGNOSIS — O99011 Anemia complicating pregnancy, first trimester: Secondary | ICD-10-CM | POA: Insufficient documentation

## 2016-07-29 LAB — URINALYSIS, ROUTINE W REFLEX MICROSCOPIC
BILIRUBIN URINE: NEGATIVE
Glucose, UA: NEGATIVE mg/dL
Hgb urine dipstick: NEGATIVE
Ketones, ur: NEGATIVE mg/dL
LEUKOCYTES UA: NEGATIVE
Nitrite: NEGATIVE
PH: 6 (ref 5.0–8.0)
Protein, ur: NEGATIVE mg/dL
SPECIFIC GRAVITY, URINE: 1.02 (ref 1.005–1.030)

## 2016-07-29 LAB — CBC
HEMATOCRIT: 30.2 % — AB (ref 36.0–46.0)
HEMOGLOBIN: 9.8 g/dL — AB (ref 12.0–15.0)
MCH: 24.6 pg — ABNORMAL LOW (ref 26.0–34.0)
MCHC: 32.5 g/dL (ref 30.0–36.0)
MCV: 75.7 fL — AB (ref 78.0–100.0)
Platelets: 273 10*3/uL (ref 150–400)
RBC: 3.99 MIL/uL (ref 3.87–5.11)
RDW: 16.6 % — AB (ref 11.5–15.5)
WBC: 6.9 10*3/uL (ref 4.0–10.5)

## 2016-07-29 LAB — WET PREP, GENITAL
SPERM: NONE SEEN
TRICH WET PREP: NONE SEEN
WBC WET PREP: NONE SEEN
YEAST WET PREP: NONE SEEN

## 2016-07-29 LAB — POCT PREGNANCY, URINE: PREG TEST UR: POSITIVE — AB

## 2016-07-29 LAB — HCG, QUANTITATIVE, PREGNANCY: hCG, Beta Chain, Quant, S: 26681 m[IU]/mL — ABNORMAL HIGH (ref <5)

## 2016-07-29 MED ORDER — PRENATAL VITAMINS 28-0.8 MG PO TABS: 1.0000 | ORAL_TABLET | Freq: Every day | ORAL | 3 refills | Status: DC

## 2016-07-29 MED ORDER — METRONIDAZOLE 500 MG PO TABS: 500.0000 mg | ORAL_TABLET | Freq: Two times a day (BID) | ORAL | 0 refills | Status: DC

## 2016-07-29 NOTE — Discharge Instructions (Signed)
Anemia, Nonspecific Anemia is a condition in which the concentration of red blood cells or hemoglobin in the blood is below normal. Hemoglobin is a substance in red blood cells that carries oxygen to the tissues of the body. Anemia results in not enough oxygen reaching these tissues.  CAUSES  Common causes of anemia include:   Excessive bleeding. Bleeding may be internal or external. This includes excessive bleeding from periods (in women) or from the intestine.   Poor nutrition.   Chronic kidney, thyroid, and liver disease.  Bone marrow disorders that decrease red blood cell production.  Cancer and treatments for cancer.  HIV, AIDS, and their treatments.  Spleen problems that increase red blood cell destruction.  Blood disorders.  Excess destruction of red blood cells due to infection, medicines, and autoimmune disorders. SIGNS AND SYMPTOMS   Minor weakness.   Dizziness.   Headache.  Palpitations.   Shortness of breath, especially with exercise.   Paleness.  Cold sensitivity.  Indigestion.  Nausea.  Difficulty sleeping.  Difficulty concentrating. Symptoms may occur suddenly or they may develop slowly.  DIAGNOSIS  Additional blood tests are often needed. These help your health care provider determine the best treatment. Your health care provider will check your stool for blood and look for other causes of blood loss.  TREATMENT  Treatment varies depending on the cause of the anemia. Treatment can include:   Supplements of iron, vitamin B12, or folic acid.   Hormone medicines.   A blood transfusion. This may be needed if blood loss is severe.   Hospitalization. This may be needed if there is significant continual blood loss.   Dietary changes.  Spleen removal. HOME CARE INSTRUCTIONS Keep all follow-up appointments. It often takes many weeks to correct anemia, and having your health care provider check on your condition and your response to  treatment is very important. SEEK IMMEDIATE MEDICAL CARE IF:   You develop extreme weakness, shortness of breath, or chest pain.   You become dizzy or have trouble concentrating.  You develop heavy vaginal bleeding.   You develop a rash.   You have bloody or black, tarry stools.   You faint.   You vomit up blood.   You vomit repeatedly.   You have abdominal pain.  You have a fever or persistent symptoms for more than 2-3 days.   You have a fever and your symptoms suddenly get worse.   You are dehydrated.  MAKE SURE YOU:  Understand these instructions.  Will watch your condition.  Will get help right away if you are not doing well or get worse.   This information is not intended to replace advice given to you by your health care provider. Make sure you discuss any questions you have with your health care provider.   Document Released: 01/12/2005 Document Revised: 08/07/2013 Document Reviewed: 05/31/2013 Elsevier Interactive Patient Education 2016 Else[employer] Inc.   Bacterial Vaginosis Bacterial vaginosis is a vaginal infection that occurs when the normal balance of bacteria in the vagina is disrupted. It results from an overgrowth of certain bacteria. This is the most common vaginal infection in women of childbearing age. Treatment is important to prevent complications, especially in pregnant women, as it can cause a premature delivery. CAUSES  Bacterial vaginosis is caused by an increase in harmful bacteria that are normally present in smaller amounts in the vagina. Several different kinds of bacteria can cause bacterial vaginosis. However, the reason that the condition develops is not fully understood. RISK FACTORS  Certain activities or behaviors can put you at an increased risk of developing bacterial vaginosis, including:  Having a new sex partner or multiple sex partners.  Douching.  Using an intrauterine device (IUD) for contraception. Women do not get  bacterial vaginosis from toilet seats, bedding, swimming pools, or contact with objects around them. SIGNS AND SYMPTOMS  Some women with bacterial vaginosis have no signs or symptoms. Common symptoms include:  Grey vaginal discharge.  A fishlike odor with discharge, especially after sexual intercourse.  Itching or burning of the vagina and vulva.  Burning or pain with urination. DIAGNOSIS  Your health care provider will take a medical history and examine the vagina for signs of bacterial vaginosis. A sample of vaginal fluid may be taken. Your health care provider will look at this sample under a microscope to check for bacteria and abnormal cells. A vaginal pH test may also be done.  TREATMENT  Bacterial vaginosis may be treated with antibiotic medicines. These may be given in the form of a pill or a vaginal cream. A second round of antibiotics may be prescribed if the condition comes back after treatment. Because bacterial vaginosis increases your risk for sexually transmitted diseases, getting treated can help reduce your risk for chlamydia, gonorrhea, HIV, and herpes. HOME CARE INSTRUCTIONS   Only take over-the-counter or prescription medicines as directed by your health care provider.  If antibiotic medicine was prescribed, take it as directed. Make sure you finish it even if you start to feel better.  Tell all sexual partners that you have a vaginal infection. They should see their health care provider and be treated if they have problems, such as a mild rash or itching.  During treatment, it is important that you follow these instructions:  Avoid sexual activity or use condoms correctly.  Do not douche.  Avoid alcohol as directed by your health care provider.  Avoid breastfeeding as directed by your health care provider. SEEK MEDICAL CARE IF:   Your symptoms are not improving after 3 days of treatment.  You have increased discharge or pain.  You have a fever. MAKE SURE  YOU:   Understand these instructions.  Will watch your condition.  Will get help right away if you are not doing well or get worse. FOR MORE INFORMATION  Centers for Disease Control and Prevention, Division of STD Prevention: [employer].co.zawww.cdc.gov/std American Sexual Health Association (ASHA): www.ashastd.org    This information is not intended to replace advice given to you by your health care provider. Make sure you discuss any questions you have with your health care provider.   Document Released: 12/05/2005 Document Revised: 12/26/2014 Document Reviewed: 07/17/2013 Elsevier Interactive Patient Education [employer] Else[employer] Inc.

## 2016-07-29 NOTE — MAU Note (Signed)
Patient presents with cramping in the abdominal pain started a few weeks ago, LMP 06/19/16, denies vaginal bleeding, no discharge, having pressure after urinating.

## 2016-07-29 NOTE — MAU Provider Note (Signed)
History     CSN: [id number]  Arrival date and time: 07/29/16 1323   First Provider Initiated Contact with Patient 07/29/16 1420      Chief Complaint  Patient presents with  . Abdominal Pain   HPI   [name] is a 35 y.o.female [id number] @ [id number] here with abdominal pain. The stomach pain started within the last month. The pain comes and goes. The pain is located all over her abdomen. The pain is sharp at times, and crampy at times.  She was unaware that she was pregnant; she is certain of her LMP. She denies vaginal bleeding   OB History    Gravida Para Term Preterm AB Living   5 4 3 1   5    SAB TAB Ectopic Multiple Live Births         1 4      No past medical history on file.  Past Surgical History:  Procedure Laterality Date  . CESAREAN SECTION     x 3  . CESAREAN SECTION N/A 04/04/2014   Procedure: CESAREAN SECTION;  Surgeon: Brock Badharles A Harper, MD;  Location: [facility];  Service: Obstetrics;  Laterality: N/A;    Family History  Problem Relation Age of Onset  . Kidney disease Maternal Grandmother   . Kidney disease Brother     deceased at age 35  . Diabetes Neg Hx   . Hypertension Neg Hx   . Cancer Neg Hx   . Heart disease Neg Hx     Social History  Substance Use Topics  . Smoking status: Never Smoker  . Smokeless tobacco: Never Used  . Alcohol use No    Allergies: No Known Allergies  Prescriptions Prior to Admission  Medication Sig Dispense Refill Last Dose  . aspirin-acetaminophen-caffeine (EXCEDRIN MIGRAINE) 250-250-65 MG tablet Take 2 tablets by mouth every 6 (six) hours as needed for headache.   Past Month at Unknown time  . DiphenhydrAMINE HCl (BENADRYL PO) Take 1 tablet by mouth daily as needed (allergies).   Past Week at Unknown time   Results for orders placed or performed during the hospital encounter of 07/29/16 (from the past 48 hour(s))  Urinalysis, Routine w reflex microscopic (not [facility])     Status: None   Collection Time: 07/29/16   1:35 PM  Result Value Ref Range   Color, Urine YELLOW YELLOW   APPearance CLEAR CLEAR   Specific Gravity, Urine 1.020 1.005 - 1.030   pH 6.0 5.0 - 8.0   Glucose, UA NEGATIVE NEGATIVE mg/dL   Hgb urine dipstick NEGATIVE NEGATIVE   Bilirubin Urine NEGATIVE NEGATIVE   Ketones, ur NEGATIVE NEGATIVE mg/dL   Protein, ur NEGATIVE NEGATIVE mg/dL   Nitrite NEGATIVE NEGATIVE   Leukocytes, UA NEGATIVE NEGATIVE    Comment: MICROSCOPIC NOT DONE ON URINES WITH NEGATIVE PROTEIN, BLOOD, LEUKOCYTES, NITRITE, OR GLUCOSE <1000 mg/dL.  Pregnancy, urine POC     Status: Abnormal   Collection Time: 07/29/16  1:38 PM  Result Value Ref Range   Preg Test, Ur POSITIVE (A) NEGATIVE    Comment:        THE SENSITIVITY OF THIS METHODOLOGY IS >24 mIU/mL   Wet prep, genital     Status: Abnormal   Collection Time: 07/29/16  2:45 PM  Result Value Ref Range   Yeast Wet Prep HPF POC NONE SEEN NONE SEEN   Trich, Wet Prep NONE SEEN NONE SEEN   Clue Cells Wet Prep HPF POC PRESENT (A) NONE SEEN  WBC, Wet Prep HPF POC NONE SEEN NONE SEEN   Sperm NONE SEEN   CBC     Status: Abnormal   Collection Time: 07/29/16  2:55 PM  Result Value Ref Range   WBC 6.9 4.0 - 10.5 K/uL   RBC 3.99 3.87 - 5.11 MIL/uL   Hemoglobin 9.8 (L) 12.0 - 15.0 g/dL   HCT 09.8 (L) 11.9 - 14.7 %   MCV 75.7 (L) 78.0 - 100.0 fL   MCH 24.6 (L) 26.0 - 34.0 pg   MCHC 32.5 30.0 - 36.0 g/dL   RDW 82.9 (H) 56.2 - 13.0 %   Platelets 273 150 - 400 K/uL  hCG, quantitative, pregnancy     Status: Abnormal   Collection Time: 07/29/16  2:55 PM  Result Value Ref Range   hCG, Beta Chain, Quant, S 26,681 (H) <5 mIU/mL    Comment:          GEST. AGE      CONC.  (mIU/mL)   <=1 WEEK        5 - 50     2 WEEKS       50 - 500     3 WEEKS       100 - 10,000     4 WEEKS     1,000 - 30,000     5 WEEKS     3,500 - 115,000   6-8 WEEKS     12,000 - 270,000    12 WEEKS     15,000 - 220,000        FEMALE AND NON-PREGNANT FEMALE:     LESS THAN 5 mIU/mL     US Ob  Comp Less 14 Wks  Result Date: 07/29/2016 CLINICAL DATA:  Assess viability. EXAM: OBSTETRIC <14 WK [place] AND TRANSVAGINAL OB US TECHNIQUE: Both transabdominal and transvaginal ultrasound examinations were performed for complete evaluation of the gestation as well as the maternal uterus, adnexal regions, and pelvic cul-de-sac. Transvaginal technique was performed to assess early pregnancy. COMPARISON:  None. FINDINGS: Intrauterine gestational sac: Single Yolk sac:  Yes Embryo:  No Cardiac Activity: No Heart Rate: Not applicable  Bpm MSD: 13  Mm   6 w   1  d Subchorionic hemorrhage: Moderate size subchorionic hemorrhage noted. Maternal uterus/adnexae: Right ovary: Normal Left ovary: Normal Other :None Free fluid:  None IMPRESSION: 1. Probable early intrauterine gestational sac with a yolk sac, but no fetal pole, or cardiac activity yet visualized. Recommend follow-up quantitative B-HCG levels and follow-up US in 14 days to confirm and assess viability. This recommendation follows SRU consensus guidelines: Diagnostic Criteria for Nonviable Pregnancy Early in the First Trimester. Malva Limes Med 2013; 865:7846-96. 2. Moderate size subchorionic hemorrhage. Electronically Signed   By: Signa Kell M.D.   On: 07/29/2016 16:36   US Ob Transvaginal  Result Date: 07/29/2016 CLINICAL DATA:  Assess viability. EXAM: OBSTETRIC <14 WK [place] AND TRANSVAGINAL OB US TECHNIQUE: Both transabdominal and transvaginal ultrasound examinations were performed for complete evaluation of the gestation as well as the maternal uterus, adnexal regions, and pelvic cul-de-sac. Transvaginal technique was performed to assess early pregnancy. COMPARISON:  None. FINDINGS: Intrauterine gestational sac: Single Yolk sac:  Yes Embryo:  No Cardiac Activity: No Heart Rate: Not applicable  Bpm MSD: 13  Mm   6 w   1  d Subchorionic hemorrhage: Moderate size subchorionic hemorrhage noted. Maternal uterus/adnexae: Right ovary: Normal Left ovary: Normal Other  :None Free fluid:  None IMPRESSION: 1. Probable early  intrauterine gestational sac with a yolk sac, but no fetal pole, or cardiac activity yet visualized. Recommend follow-up quantitative B-HCG levels and follow-up US in 14 days to confirm and assess viability. This recommendation follows SRU consensus guidelines: Diagnostic Criteria for Nonviable Pregnancy Early in the First Trimester. Malva Limes Med 2013; 409:8119-14. 2. Moderate size subchorionic hemorrhage. Electronically Signed   By: Signa Kell M.D.   On: 07/29/2016 16:36    Review of Systems  Gastrointestinal: Positive for abdominal pain.  Genitourinary: Negative for dysuria.   Physical Exam   Blood pressure 112/65, pulse 79, temperature 98.6 F (37 C), resp. rate 18, height 5' (1.524 m), weight 152 lb (68.9 kg), last menstrual period 06/19/2016.  Physical Exam  Constitutional: She is oriented to person, place, and time. She appears well-developed and well-nourished. No distress.  HENT:  Head: Normocephalic.  Eyes: Pupils are equal, round, and reactive to light.  GI: Soft. She exhibits no distension and no mass. There is no tenderness. There is no rebound and no guarding.  Genitourinary:  Genitourinary Comments: Speculum exam: Vagina - Small amount of creamy discharge, mild odor Cervix - No contact bleeding Bimanual exam: Cervix closed Uterus non tender, normal size Adnexa non tender, no masses bilaterally GC/Chlam, wet prep done Chaperone present for exam.  Musculoskeletal: Normal range of motion.  Neurological: She is alert and oriented to person, place, and time.  Skin: Skin is warm. She is not diaphoretic.  Psychiatric: Her behavior is normal.    MAU Course  Procedures  None  MDM  Wet prep GC CBC Hcg ABO  [place]   Assessment and Plan   A:  1. Abdominal pain in pregnancy, antepartum   2. Anemia in pregnancy, first trimester   3. BV (bacterial vaginosis)     P:  Discharge home in stable condition First  trimester warning signs Rx: Prenatal vitamins with Iron        Flagyl Start prenatal care Return to MAU if symptoms worsen    Duane Lope, NP 07/29/2016 4:54 PM

## 2016-07-30 LAB — HIV ANTIBODY (ROUTINE TESTING W REFLEX): HIV SCREEN 4TH GENERATION: NONREACTIVE

## 2016-08-01 LAB — GC/CHLAMYDIA PROBE AMP (~~LOC~~) NOT AT ARMC
Chlamydia: NEGATIVE
Neisseria Gonorrhea: NEGATIVE

## 2016-10-20 ENCOUNTER — Ambulatory Visit: Payer: Medicaid Other | Admitting: Obstetrics

## 2017-06-03 ENCOUNTER — Encounter (HOSPITAL_COMMUNITY): Payer: Self-pay

## 2017-07-13 ENCOUNTER — Other Ambulatory Visit (HOSPITAL_COMMUNITY)
Admission: RE | Admit: 2017-07-13 | Discharge: 2017-07-13 | Disposition: A | Discharge status: Home / Self Care | Payer: Medicaid Other | Source: Ambulatory Visit | Attending: Obstetrics | Admitting: Obstetrics

## 2017-07-13 ENCOUNTER — Ambulatory Visit (INDEPENDENT_AMBULATORY_CARE_PROVIDER_SITE_OTHER): Payer: Medicaid Other | Admitting: Obstetrics

## 2017-07-13 ENCOUNTER — Encounter: Payer: Self-pay | Admitting: Obstetrics

## 2017-07-13 VITALS — BP 114/77 | HR 75 | Wt 147.6 lb

## 2017-07-13 DIAGNOSIS — Z01419 Encounter for gynecological examination (general) (routine) without abnormal findings: Secondary | ICD-10-CM | POA: Diagnosis not present

## 2017-07-13 DIAGNOSIS — N898 Other specified noninflammatory disorders of vagina: Secondary | ICD-10-CM

## 2017-07-13 DIAGNOSIS — O035 Genital tract and pelvic infection following complete or unspecified spontaneous abortion: Secondary | ICD-10-CM

## 2017-07-13 DIAGNOSIS — Z3202 Encounter for pregnancy test, result negative: Secondary | ICD-10-CM

## 2017-07-13 DIAGNOSIS — Z3042 Encounter for surveillance of injectable contraceptive: Secondary | ICD-10-CM | POA: Diagnosis not present

## 2017-07-13 DIAGNOSIS — Z0000 Encounter for general adult medical examination without abnormal findings: Secondary | ICD-10-CM

## 2017-07-13 DIAGNOSIS — Z3009 Encounter for other general counseling and advice on contraception: Secondary | ICD-10-CM

## 2017-07-13 DIAGNOSIS — N711 Chronic inflammatory disease of uterus: Secondary | ICD-10-CM | POA: Insufficient documentation

## 2017-07-13 DIAGNOSIS — Z30013 Encounter for initial prescription of injectable contraceptive: Secondary | ICD-10-CM

## 2017-07-13 LAB — POCT URINE PREGNANCY: PREG TEST UR: NEGATIVE

## 2017-07-13 MED ORDER — MEDROXYPROGESTERONE ACETATE 150 MG/ML IM SUSP: 150.0000 mg | INTRAMUSCULAR | 4 refills | Status: DC

## 2017-07-13 MED ORDER — MEDROXYPROGESTERONE ACETATE 150 MG/ML IM SUSP
150.0000 mg | Freq: Once | INTRAMUSCULAR | Status: AC
  Administered 2017-07-13: 150 mg via INTRAMUSCULAR

## 2017-07-13 MED ORDER — DOXYCYCLINE HYCLATE 100 MG PO CAPS: 100.0000 mg | ORAL_CAPSULE | Freq: Two times a day (BID) | ORAL | 0 refills | Status: DC

## 2017-07-13 MED ORDER — METRONIDAZOLE 500 MG PO TABS: 500.0000 mg | ORAL_TABLET | Freq: Two times a day (BID) | ORAL | 0 refills | Status: DC

## 2017-07-13 NOTE — Progress Notes (Signed)
Patient is in the office today for annual exam- she wants to discuss starting depo provera for birth control. Patient had termination (07/2016)- she had abdominal pain and went to the ED and was told she might have endometritis. Patient has had persistent pain sometimes with intercourse and at unexplained other times.

## 2017-07-13 NOTE — Progress Notes (Signed)
Subjective:        Dorothy Lopez is a 36 y.o. female here for a routine exam.  Current complaints: S/P Medical Abortion in August 2017 followed by pelvic pain and diagnosis of endometritis. She was given a week of antibiotics and the pain improved.  She recently has had a return of pain that is intermittent, sometimes with intercourse.  Denies fever / chills or dysuria.  Personal health questionnaire:  Is patient Ashkenazi Jewish, have a family history of breast and/or ovarian cancer: no Is there a family history of uterine cancer diagnosed at age < 109, gastrointestinal cancer, urinary tract cancer, family member who is a Personnel officer syndrome-associated carrier: no Is the patient overweight and hypertensive, family history of diabetes, personal history of gestational diabetes, preeclampsia or PCOS: no Is patient over 12, have PCOS,  family history of premature CHD under age 39, diabetes, smoke, have hypertension or peripheral artery disease:  no At any time, has a partner hit, kicked or otherwise hurt or frightened you?: no Over the past 2 weeks, have you felt down, depressed or hopeless?: no Over the past 2 weeks, have you felt little interest or pleasure in doing things?:no   Gynecologic History Patient's last menstrual period was 07/08/2017 (exact date). Contraception: none Last Pap: 2014. Results were: normal Last mammogram: n/a. Results were: n/a  Obstetric History OB History  Gravida Para Term Preterm AB Living  6 4 3 1 1 5   SAB TAB Ectopic Multiple Live Births    1   1 5     # Outcome Date GA Lbr Len/2nd Weight Sex Delivery Anes PTL Lv  6 Gravida           5A Preterm 04/04/14 [redacted]w[redacted]d  6 lb 2.4 oz (2.79 kg) M CS-LVertical Spinal, EPI  LIV  5B Preterm 04/04/14 [redacted]w[redacted]d  6 lb 5.2 oz (2.869 kg) M CS-LVertical Spinal, EPI  LIV  4 Term 10/15/10 [redacted]w[redacted]d  9 lb 9.6 oz (4.355 kg) M CS-Unspec   LIV     Birth Comments: No complications  3 Term 07/20/07 [redacted]w[redacted]d  7 lb 9.6 oz (3.447 kg) F CS-Unspec    LIV     Birth Comments: No complications  2 Term 12/27/99 [redacted]w[redacted]d  7 lb (3.175 kg) F CS-LTranv        Birth Comments: heart decel  1 TAB               History reviewed. No pertinent past medical history.  Past Surgical History:  Procedure Laterality Date  . CESAREAN SECTION     x 3  . CESAREAN SECTION N/A 04/04/2014   Procedure: CESAREAN SECTION;  Surgeon: Brock Bad, MD;  Location: [facility];  Service: Obstetrics;  Laterality: N/A;  . INDUCED ABORTION  07/2016     Current Outpatient Prescriptions:  .  doxycycline (VIBRAMYCIN) 100 MG capsule, Take 1 capsule (100 mg total) by mouth 2 (two) times daily., Disp: 28 capsule, Rfl: 0 .  medroxyPROGESTERone (DEPO-PROVERA) 150 MG/ML injection, Inject 1 mL (150 mg total) into the muscle every 3 (three) months., Disp: 1 mL, Rfl: 4 .  metroNIDAZOLE (FLAGYL) 500 MG tablet, Take 1 tablet (500 mg total) by mouth 2 (two) times daily., Disp: 28 tablet, Rfl: 0 No Known Allergies  Social History  Substance Use Topics  . Smoking status: Never Smoker  . Smokeless tobacco: Never Used  . Alcohol use No    Family History  Problem Relation Age of Onset  . Kidney disease  Maternal Grandmother   . Kidney disease Brother        deceased at age 36  . Diabetes Neg Hx   . Hypertension Neg Hx   . Cancer Neg Hx   . Heart disease Neg Hx       Review of Systems  Constitutional: negative for fatigue and weight loss Respiratory: negative for cough and wheezing Cardiovascular: negative for chest pain, fatigue and palpitations Gastrointestinal: negative for abdominal pain and change in bowel habits Musculoskeletal:negative for myalgias Neurological: negative for gait problems and tremors Behavioral/Psych: negative for abusive relationship, depression Endocrine: negative for temperature intolerance    Genitourinary:positive for pelvic pain intermittently and sometimes with intercourse  Integument/breast: negative for breast lump, breast tenderness,  nipple discharge and skin lesion(s)    Objective:       BP 114/77   Pulse 75   Wt 147 lb 9.6 oz (67 kg)   LMP 07/08/2017 (Exact Date)   Breastfeeding? No   BMI 28.83 kg/m . General:   alert  Skin:   no rash or abnormalities  Lungs:   clear to auscultation bilaterally  Heart:   regular rate and rhythm, S1, S2 normal, no murmur, click, rub or gallop  Breasts:   normal without suspicious masses, skin or nipple changes or axillary nodes  Abdomen:  normal findings: no organomegaly, soft, non-tender and no hernia  Pelvis:  External genitalia: normal general appearance Urinary system: urethral meatus normal and bladder without fullness, nontender Vaginal: malodorous light-pinkish creamy discharge  Cervix: normal appearance Adnexa: normal bimanual exam Uterus: anteverted and non-tender, normal size   Lab Review Urine pregnancy test Labs reviewed yes Radiologic studies reviewed yes  50% of 20 min visit spent on counseling and coordination of care.    Assessment:     1. Encounter for routine gynecological examination with Papanicolaou smear of cervix Rx: - Cytology - PAP - POCT urine pregnancy  2. Spontaneous abortion with endometritis - treated with a week of antibiotics, with resolution   3. Chronic endometritis Rx: - Cervicovaginal ancillary only - doxycycline (VIBRAMYCIN) 100 MG capsule; Take 1 capsule (100 mg total) by mouth 2 (two) times daily.  Dispense: 28 capsule; Refill: 0 - metroNIDAZOLE (FLAGYL) 500 MG tablet; Take 1 tablet (500 mg total) by mouth 2 (two) times daily.  Dispense: 28 tablet; Refill: 0  4. Foul smelling vaginal discharge Rx: - Cervicovaginal ancillary only - doxycycline (VIBRAMYCIN) 100 MG capsule; Take 1 capsule (100 mg total) by mouth 2 (two) times daily.  Dispense: 28 capsule; Refill: 0 - metroNIDAZOLE (FLAGYL) 500 MG tablet; Take 1 tablet (500 mg total) by mouth 2 (two) times daily.  Dispense: 28 tablet; Refill: 0  5. Encounter for other  general counseling and advice on contraception - wants Depo Provera  6. Encounter for initial prescription of injectable contraceptive Rx: - medroxyPROGESTERone (DEPO-PROVERA) 150 MG/ML injection; Inject 1 mL (150 mg total) into the muscle every 3 (three) months.  Dispense: 1 mL; Refill: 4     Plan:    Education reviewed: calcium supplements, depression evaluation, low fat, low cholesterol diet, safe sex/STD prevention, self breast exams and weight bearing exercise. Contraception: Depo-Provera injections. Follow up in: 1 year.   Meds ordered this encounter  Medications  . doxycycline (VIBRAMYCIN) 100 MG capsule    Sig: Take 1 capsule (100 mg total) by mouth 2 (two) times daily.    Dispense:  28 capsule    Refill:  0  . metroNIDAZOLE (FLAGYL) 500 MG tablet  Sig: Take 1 tablet (500 mg total) by mouth 2 (two) times daily.    Dispense:  28 tablet    Refill:  0  . medroxyPROGESTERone (DEPO-PROVERA) 150 MG/ML injection    Sig: Inject 1 mL (150 mg total) into the muscle every 3 (three) months.    Dispense:  1 mL    Refill:  4  . medroxyPROGESTERone (DEPO-PROVERA) injection 150 mg   Orders Placed This Encounter  Procedures  . POCT urine pregnancy       Patient ID: Tiajuana AmassDonetta R Bubolz, female   DOB: 04-27-1981, 36 y.o.   MRN: [id number]

## 2017-07-14 LAB — CERVICOVAGINAL ANCILLARY ONLY
BACTERIAL VAGINITIS: NEGATIVE
Candida vaginitis: NEGATIVE
Trichomonas: NEGATIVE

## 2017-07-18 LAB — CYTOLOGY - PAP
DIAGNOSIS: NEGATIVE
HPV: NOT DETECTED

## 2017-09-28 ENCOUNTER — Ambulatory Visit (INDEPENDENT_AMBULATORY_CARE_PROVIDER_SITE_OTHER): Payer: Medicaid Other

## 2017-09-28 VITALS — Ht 60.0 in | Wt 150.2 lb

## 2017-09-28 DIAGNOSIS — Z3042 Encounter for surveillance of injectable contraceptive: Secondary | ICD-10-CM | POA: Diagnosis not present

## 2017-09-28 MED ORDER — MEDROXYPROGESTERONE ACETATE 150 MG/ML IM SUSP
150.0000 mg | INTRAMUSCULAR | Status: AC
  Administered 2017-09-28: 150 mg via INTRAMUSCULAR

## 2017-09-28 NOTE — Progress Notes (Signed)
Nurse visit for pt supply Depo. Pt is on time for injection.  Pt tolerated injection well.  Next Depo Dec 27-Jan 10 pt agrees.

## 2017-12-15 ENCOUNTER — Ambulatory Visit: Payer: Medicaid Other

## 2017-12-18 ENCOUNTER — Telehealth: Payer: Self-pay | Admitting: *Deleted

## 2017-12-18 NOTE — Telephone Encounter (Signed)
Lft vmail for patient to callback and reschedule or let us know her plans. 

## 2018-11-06 ENCOUNTER — Emergency Department (HOSPITAL_COMMUNITY): Payer: Medicaid Other

## 2018-11-06 ENCOUNTER — Encounter (HOSPITAL_COMMUNITY): Payer: Self-pay | Admitting: *Deleted

## 2018-11-06 ENCOUNTER — Emergency Department (HOSPITAL_COMMUNITY)
Admission: EM | Admit: 2018-11-06 | Discharge: 2018-11-06 | Disposition: A | Discharge status: Home / Self Care | Payer: Medicaid Other | Attending: Emergency Medicine | Admitting: Emergency Medicine

## 2018-11-06 DIAGNOSIS — W450XXA Nail entering through skin, initial encounter: Secondary | ICD-10-CM | POA: Insufficient documentation

## 2018-11-06 DIAGNOSIS — S99921A Unspecified injury of right foot, initial encounter: Secondary | ICD-10-CM | POA: Diagnosis present

## 2018-11-06 DIAGNOSIS — Y999 Unspecified external cause status: Secondary | ICD-10-CM | POA: Diagnosis not present

## 2018-11-06 DIAGNOSIS — Y9389 Activity, other specified: Secondary | ICD-10-CM | POA: Diagnosis not present

## 2018-11-06 DIAGNOSIS — Y9289 Other specified places as the place of occurrence of the external cause: Secondary | ICD-10-CM | POA: Insufficient documentation

## 2018-11-06 DIAGNOSIS — S91331A Puncture wound without foreign body, right foot, initial encounter: Secondary | ICD-10-CM

## 2018-11-06 DIAGNOSIS — Z23 Encounter for immunization: Secondary | ICD-10-CM | POA: Diagnosis not present

## 2018-11-06 MED ORDER — TETANUS-DIPHTH-ACELL PERTUSSIS 5-2.5-18.5 LF-MCG/0.5 IM SUSP
0.5000 mL | Freq: Once | INTRAMUSCULAR | Status: AC
  Administered 2018-11-06: 0.5 mL via INTRAMUSCULAR
  Filled 2018-11-06: qty 0.5

## 2018-11-06 MED ORDER — IBUPROFEN 200 MG PO TABS
600.0000 mg | ORAL_TABLET | Freq: Once | ORAL | Status: AC
  Administered 2018-11-06: 600 mg via ORAL
  Filled 2018-11-06: qty 3

## 2018-11-06 MED ORDER — BACITRACIN ZINC 500 UNIT/GM EX OINT: TOPICAL_OINTMENT | Freq: Two times a day (BID) | CUTANEOUS | Status: DC

## 2018-11-06 MED ORDER — CIPROFLOXACIN HCL 500 MG PO TABS: 500.0000 mg | ORAL_TABLET | Freq: Two times a day (BID) | ORAL | 0 refills | Status: DC

## 2018-11-06 NOTE — ED Triage Notes (Addendum)
Per EMS, pt stepped on a screw with her right foot when walking today. The screw went through her shoe and into her foot. The screw is no longer in her foot. Pt has pain in foot with pressure.  BP 130/66 HR 60 SpO2 99% RA RR 16

## 2018-11-06 NOTE — ED Provider Notes (Signed)
[facility]-EMERGENCY DEPT Provider Note   CSN: [id number] Arrival date & time: 11/06/18  1629     History   Chief Complaint Chief Complaint  Patient presents with  . Foot Injury    HPI Dorothy Lopez is a 37 y.o. female.  Dorothy Lopez is a 37 y.o. Female who is otherwise healthy, presents for evaluation via EMS after she stepped on a screw that went into her right foot while walking today.  The injury occurred just prior to arrival.  She was wearing flats with rubber soles, and the screw went through her shoe into the arch of her foot about 1.5 cm.  She reports pain immediately around the puncture site, initially had some bleeding which has now stopped.  She denies any numbness tingling or weakness in the foot.  She is able to move her toes without difficulty with mild discomfort.  She reports pain is worse when she puts pressure on the foot to walk.  Patient is not exactly sure when her last tetanus was, thinks she may have gotten 1 5 years ago but does not have any records.     History reviewed. No pertinent past medical history.  Patient Active Problem List   Diagnosis Date Noted  . Spontaneous abortion with endometritis 07/13/2017  . Chronic endometritis 07/13/2017  . Foul smelling vaginal discharge 07/13/2017  . Disruption of cesarean wound, with delivery, with mention of postpartum complication 04/21/2014  . Pain 04/21/2014  . S/P cesarean section 04/04/2014  . Contraception 02/28/2012  . Influenza-like illness 12/07/2011  . KNEE PAIN, BILATERAL 11/17/2008    Past Surgical History:  Procedure Laterality Date  . CESAREAN SECTION     x 3  . CESAREAN SECTION N/A 04/04/2014   Procedure: CESAREAN SECTION;  Surgeon: Brock Bad, MD;  Location: [facility];  Service: Obstetrics;  Laterality: N/A;  . INDUCED ABORTION  07/2016     OB History    Gravida  6   Para  4   Term  3   Preterm  1   AB  1   Living  5     SAB      TAB  1   Ectopic      Multiple  1   Live Births  5            Home Medications    Prior to Admission medications   Medication Sig Start Date End Date Taking? Authorizing Provider  ciprofloxacin (CIPRO) 500 MG tablet Take 1 tablet (500 mg total) by mouth 2 (two) times daily. One po bid x 7 days 11/06/18   Dartha Lodge, PA-C  doxycycline (VIBRAMYCIN) 100 MG capsule Take 1 capsule (100 mg total) by mouth 2 (two) times daily. Patient not taking: Reported on 09/28/2017 07/13/17   Brock Bad, MD  medroxyPROGESTERone (DEPO-PROVERA) 150 MG/ML injection Inject 1 mL (150 mg total) into the muscle every 3 (three) months. 07/13/17   Brock Bad, MD  metroNIDAZOLE (FLAGYL) 500 MG tablet Take 1 tablet (500 mg total) by mouth 2 (two) times daily. Patient not taking: Reported on 09/28/2017 07/13/17   Brock Bad, MD    Family History Family History  Problem Relation Age of Onset  . Kidney disease Maternal Grandmother   . Kidney disease Brother        deceased at age 63  . Diabetes Neg Hx   . Hypertension Neg Hx   . Cancer Neg Hx   .  Heart disease Neg Hx     Social History Social History   Tobacco Use  . Smoking status: Never Smoker  . Smokeless tobacco: Never Used  Substance Use Topics  . Alcohol use: No  . Drug use: No     Allergies   Patient has no known allergies.   Review of Systems Review of Systems  Constitutional: Negative for chills and fever.  Musculoskeletal: Negative for arthralgias and joint swelling.  Skin: Positive for wound.  Neurological: Negative for weakness and numbness.     Physical Exam Updated Vital Signs BP (!) 135/94 (BP Location: Right Arm)   Pulse 74   Temp 98.8 F (37.1 C) (Oral)   Resp 18   SpO2 100%   Physical Exam  Constitutional: She appears well-developed and well-nourished. No distress.  HENT:  Head: Normocephalic and atraumatic.  Eyes: Right eye exhibits no discharge. Left eye exhibits no discharge.    Pulmonary/Chest: Effort normal. No respiratory distress.  Musculoskeletal:       Feet:  Puncture wound to the bottom of the right foot as noted below 2+ DP and PT pulses, sensation intact throughout the foot, able to wiggle all toes without difficulty.  Neurological: She is alert. Coordination normal.  Skin: Skin is warm and dry. Capillary refill takes less than 2 seconds. She is not diaphoretic.  Psychiatric: She has a normal mood and affect. Her behavior is normal.  Nursing note and vitals reviewed.    ED Treatments / Results  Labs (all labs ordered are listed, but only abnormal results are displayed) Labs Reviewed - No data to display  EKG None  Radiology Dg Foot Complete Right  Result Date: 11/06/2018 CLINICAL DATA:  Patient stepped on a metallic screw. EXAM: RIGHT FOOT COMPLETE - 3+ VIEW COMPARISON:  10/10/2013 FINDINGS: No radiopaque foreign body. 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 No soft tissue emphysema. No bone destruction or fracture. IMPRESSION: No radiopaque foreign body. Stable hallux valgus. No acute osseous abnormality. Electronically Signed   By: Tollie Eth M.D.   On: 11/06/2018 17:33    Procedures Procedures (including critical care time)  Medications Ordered in ED Medications  Tdap (BOOSTRIX) injection 0.5 mL (0.5 mLs Intramuscular Given 11/06/18 1729)  ibuprofen (ADVIL,MOTRIN) tablet 600 mg (600 mg Oral Given 11/06/18 1713)     Initial Impression / Assessment and Plan / ED Course  I have reviewed the triage vital signs and the nursing notes.  Pertinent labs & imaging results that were available during my care of the patient were reviewed by me and considered in my medical decision making (see chart for details).  Presents for puncture wound to the right foot after she stepped on a  screw.  There is a small wound in the middle of the arch of her right foot with minimal bleeding, there is no obvious palpable deformity, the screw only went in about 1.5 cm, did not puncture through the top of the foot.  This did go through a rubber soled shoe.  Will treat with Cipro for infection prophylaxis.  X-ray shows no evidence of foreign body or bony abnormality, and the foot is neurovascularly intact.  The area was copiously irrigated and cleaned, antibiotic ointment and dressing applied.  Patient was able to walk on the foot without difficulty.  Discussed strict return precautions.  Patient is stable for  discharge home at this time in agreement with plan.  Final Clinical Impressions(s) / ED Diagnoses   Final diagnoses:  Puncture wound of right foot, initial encounter    ED Discharge Orders         Ordered    ciprofloxacin (CIPRO) 500 MG tablet  2 times daily     11/06/18 1808           Dartha LodgeFord, Addylin Manke N, [place]PA-C 11/06/18 1812    Mancel BaleWentz, Elliott, MD 11/09/18 [id number]

## 2018-11-06 NOTE — Discharge Instructions (Signed)
Keep the wound covered with antibiotic ointment and a dressing, change dressing daily, you can wash the area in the shower, monitor for any signs of worsening infection such as redness, swelling, drainage, increasing pain or fevers as these should warrant return to the emergency department for reevaluation.  Please take antibiotics as directed, make sure you complete the entire course.  Return for signs of infection, increasing pain, tingling or weakness in the foot or any other new or concerning symptoms.

## 2018-11-08 ENCOUNTER — Emergency Department (HOSPITAL_COMMUNITY)
Admission: EM | Admit: 2018-11-08 | Discharge: 2018-11-08 | Disposition: A | Discharge status: Home / Self Care | Payer: Medicaid Other | Attending: Emergency Medicine | Admitting: Emergency Medicine

## 2018-11-08 ENCOUNTER — Other Ambulatory Visit: Payer: Self-pay

## 2018-11-08 ENCOUNTER — Encounter (HOSPITAL_COMMUNITY): Payer: Self-pay

## 2018-11-08 DIAGNOSIS — Y9301 Activity, walking, marching and hiking: Secondary | ICD-10-CM | POA: Insufficient documentation

## 2018-11-08 DIAGNOSIS — W268XXA Contact with other sharp object(s), not elsewhere classified, initial encounter: Secondary | ICD-10-CM | POA: Diagnosis not present

## 2018-11-08 DIAGNOSIS — Y999 Unspecified external cause status: Secondary | ICD-10-CM | POA: Insufficient documentation

## 2018-11-08 DIAGNOSIS — Z79899 Other long term (current) drug therapy: Secondary | ICD-10-CM | POA: Diagnosis not present

## 2018-11-08 DIAGNOSIS — Y929 Unspecified place or not applicable: Secondary | ICD-10-CM | POA: Diagnosis not present

## 2018-11-08 DIAGNOSIS — S91331A Puncture wound without foreign body, right foot, initial encounter: Secondary | ICD-10-CM | POA: Diagnosis not present

## 2018-11-08 DIAGNOSIS — S99921A Unspecified injury of right foot, initial encounter: Secondary | ICD-10-CM | POA: Diagnosis present

## 2018-11-08 MED ORDER — HYDROCODONE-ACETAMINOPHEN 5-325 MG PO TABS: 1.0000 | ORAL_TABLET | ORAL | 0 refills | Status: DC | PRN

## 2018-11-08 MED ORDER — IBUPROFEN 200 MG PO TABS
600.0000 mg | ORAL_TABLET | Freq: Once | ORAL | Status: AC
  Administered 2018-11-08: 600 mg via ORAL
  Filled 2018-11-08: qty 3

## 2018-11-08 MED ORDER — HYDROCODONE-ACETAMINOPHEN 5-325 MG PO TABS
1.0000 | ORAL_TABLET | Freq: Once | ORAL | Status: AC
Start: 2018-11-08 — End: 2018-11-08
  Administered 2018-11-08: 1 via ORAL
  Filled 2018-11-08: qty 1

## 2018-11-08 MED ORDER — IBUPROFEN 600 MG PO TABS: 600.0000 mg | ORAL_TABLET | Freq: Four times a day (QID) | ORAL | 0 refills | Status: DC | PRN

## 2018-11-08 NOTE — ED Triage Notes (Addendum)
Patient reports that she stepped on a screw with her right foot 2 days ago. Patient states she was seen 2 days ago and states she can not bear weight on her right foot.

## 2018-11-08 NOTE — ED Provider Notes (Signed)
[facility]-EMERGENCY DEPT Provider Note   CSN: [id number] Arrival date & time: 11/08/18  0800     History   Chief Complaint Chief Complaint  Patient presents with  . Foot Pain    HPI Dorothy Lopez is a 37 y.o. female.  Pt presents to the ED today with right sided foot pain.  She stepped on a screw on 11/19 which went through her shoe, and was seen in the ED then.  She had xrays which were nl.  Tetanus was updated.  She was put on cipro.  She has been taking her abx, but her foot is really hurting.  She can't put weight on it.  She denies f/c.     History reviewed. No pertinent past medical history.  Patient Active Problem List   Diagnosis Date Noted  . Spontaneous abortion with endometritis 07/13/2017  . Chronic endometritis 07/13/2017  . Foul smelling vaginal discharge 07/13/2017  . Disruption of cesarean wound, with delivery, with mention of postpartum complication 04/21/2014  . Pain 04/21/2014  . S/P cesarean section 04/04/2014  . Contraception 02/28/2012  . Influenza-like illness 12/07/2011  . KNEE PAIN, BILATERAL 11/17/2008    Past Surgical History:  Procedure Laterality Date  . CESAREAN SECTION     x 3  . CESAREAN SECTION N/A 04/04/2014   Procedure: CESAREAN SECTION;  Surgeon: Brock Bad, MD;  Location: [facility];  Service: Obstetrics;  Laterality: N/A;  . INDUCED ABORTION  07/2016     OB History    Gravida  6   Para  4   Term  3   Preterm  1   AB  1   Living  5     SAB      TAB  1   Ectopic      Multiple  1   Live Births  5            Home Medications    Prior to Admission medications   Medication Sig Start Date End Date Taking? Authorizing Provider  ciprofloxacin (CIPRO) 500 MG tablet Take 1 tablet (500 mg total) by mouth 2 (two) times daily. One po bid x 7 days 11/06/18   Dartha Lodge, PA-C  doxycycline (VIBRAMYCIN) 100 MG capsule Take 1 capsule (100 mg total) by mouth 2 (two) times daily. Patient  not taking: Reported on 09/28/2017 07/13/17   Brock Bad, MD  HYDROcodone-acetaminophen (NORCO/VICODIN) 5-325 MG tablet Take 1 tablet by mouth every 4 (four) hours as needed. 11/08/18   Jacalyn Lefevre, MD  ibuprofen (ADVIL,MOTRIN) 600 MG tablet Take 1 tablet (600 mg total) by mouth every 6 (six) hours as needed. 11/08/18   Jacalyn Lefevre, MD  medroxyPROGESTERone (DEPO-PROVERA) 150 MG/ML injection Inject 1 mL (150 mg total) into the muscle every 3 (three) months. 07/13/17   Brock Bad, MD  metroNIDAZOLE (FLAGYL) 500 MG tablet Take 1 tablet (500 mg total) by mouth 2 (two) times daily. Patient not taking: Reported on 09/28/2017 07/13/17   Brock Bad, MD    Family History Family History  Problem Relation Age of Onset  . Kidney disease Maternal Grandmother   . Kidney disease Brother        deceased at age 33  . Diabetes Neg Hx   . Hypertension Neg Hx   . Cancer Neg Hx   . Heart disease Neg Hx     Social History Social History   Tobacco Use  . Smoking status: Never Smoker  .  Smokeless tobacco: Never Used  Substance Use Topics  . Alcohol use: No  . Drug use: No     Allergies   Patient has no known allergies.   Review of Systems Review of Systems  Musculoskeletal:       Right foot pain  All other systems reviewed and are negative.    Physical Exam Updated Vital Signs BP (!) 117/92 (BP Location: Left Arm)   Pulse 84   Temp 98.9 F (37.2 C) (Oral)   Resp 16   Ht 5' (1.524 m)   Wt 71.7 kg   SpO2 100%   BMI 30.86 kg/m   Physical Exam  Constitutional: She appears well-developed and well-nourished.  Cardiovascular: Normal rate, regular rhythm, normal heart sounds and intact distal pulses.  Pulmonary/Chest: Effort normal and breath sounds normal.  Abdominal: Soft. Bowel sounds are normal.  Musculoskeletal:  Bottom of right foot:  Mild redness around puncture wound.  Tenderness to palpation.  No fluctuance.  Nursing note and vitals  reviewed.    ED Treatments / Results  Labs (all labs ordered are listed, but only abnormal results are displayed) Labs Reviewed - No data to display  EKG None  Radiology Dg Foot Complete Right  Result Date: 11/06/2018 CLINICAL DATA:  Patient stepped on a metallic screw. EXAM: RIGHT FOOT COMPLETE - 3+ VIEW COMPARISON:  10/10/2013 FINDINGS: No radiopaque foreign body. 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 No soft tissue emphysema. No bone destruction or fracture. IMPRESSION: No radiopaque foreign body. Stable hallux valgus. No acute osseous abnormality. Electronically Signed   By: Tollie Ethavid  Kwon M.D.   On: 11/06/2018 17:33    Procedures Procedures (including critical care time)  Medications Ordered in ED Medications  HYDROcodone-acetaminophen (NORCO/VICODIN) 5-325 MG per tablet 1 tablet (has no administration in time range)  ibuprofen (ADVIL,MOTRIN) tablet 600 mg (has no administration in time range)     Initial Impression / Assessment and Plan / ED Course  I have reviewed the triage vital signs and the nursing notes.  Pertinent labs & imaging results that were available during my care of the patient were reviewed by me and considered in my medical decision making (see chart for details).    Pt instructed to keep foot elevated when at home.  The pt is told to continue abx.  She is given crutches.  The pt instructed to f/u with podiatry.  Return if worse.  Final Clinical Impressions(s) / ED Diagnoses   Final diagnoses:  Puncture wound of right foot, initial encounter    ED Discharge Orders         Ordered    HYDROcodone-acetaminophen (NORCO/VICODIN) 5-325 MG tablet  Every 4 hours PRN     11/08/18 0838    ibuprofen (ADVIL,MOTRIN) 600 MG tablet  Every 6 hours PRN     11/08/18 [id number]           Jacalyn LefevreHaviland, Hildegarde Dunaway,  MD 11/08/18 [phone]

## 2018-11-08 NOTE — Discharge Instructions (Signed)
Continue Cipro

## 2018-11-14 ENCOUNTER — Encounter: Payer: Self-pay | Admitting: Podiatry

## 2018-11-14 ENCOUNTER — Ambulatory Visit: Payer: Medicaid Other | Admitting: Podiatry

## 2018-11-14 VITALS — BP 125/77 | HR 70 | Resp 16

## 2018-11-14 DIAGNOSIS — L02619 Cutaneous abscess of unspecified foot: Secondary | ICD-10-CM

## 2018-11-14 DIAGNOSIS — L03119 Cellulitis of unspecified part of limb: Secondary | ICD-10-CM | POA: Diagnosis not present

## 2018-11-14 NOTE — Progress Notes (Signed)
   Subjective:    Patient ID: Dorothy Lopez, female    DOB: August 02, 1981, 37 y.o.   MRN: [id number]  HPI    Review of Systems  All other systems reviewed and are negative.      Objective:   Physical Exam        Assessment & Plan:

## 2018-11-14 NOTE — Progress Notes (Signed)
Subjective:   Patient ID: Dorothy Lopez, female   DOB: 37 y.o.   MRN: [id number]   HPI Patient presents stating that she traumatized her right arch approximately 1 week ago and did have a screw that penetrated her plantar arch and she went to the emergency room who clean the area out and placed on antibiotics and it still is sore.  Patient does not smoke likes to be active   Review of Systems  All other systems reviewed and are negative.       Objective:  Physical Exam  Constitutional: She appears well-developed and well-nourished.  Cardiovascular: Intact distal pulses.  Pulmonary/Chest: Effort normal.  Musculoskeletal: Normal range of motion.  Neurological: She is alert.  Skin: Skin is warm.  Nursing note and vitals reviewed.   Neurovascular status found to be intact muscle strength is adequate range of motion within normal limits with patient noted to have an area of crusted tissue on the plantar right arch localized with no active edema erythema or drainage noted surrounding it but it is painful when palpated     Assessment:  Possibility for localized abscess right arch with trauma versus inflammatory reaction     Plan:  H&P and reviewed previous x-ray.  At this point using sterile debridement I did debride the area and I did not note any active drainage and I did go ahead and carefully open the area up with no indications of active drainage.  I flushed the area advised on soaks and applied padding to take pressure off of this and I gave strict instructions of any redness were to occur any swelling or any drainage the patient is to go to the emergency room and also inform us.  She will finish her antibiotics and anti-inflammatories and this should resolve over the next couple weeks with soaks but if any issues were to occur she is to let us know

## 2018-11-19 ENCOUNTER — Ambulatory Visit: Payer: Self-pay | Attending: Critical Care Medicine | Admitting: Critical Care Medicine

## 2018-11-19 ENCOUNTER — Encounter: Payer: Self-pay | Admitting: Critical Care Medicine

## 2018-11-19 ENCOUNTER — Other Ambulatory Visit: Payer: Self-pay

## 2018-11-19 DIAGNOSIS — L089 Local infection of the skin and subcutaneous tissue, unspecified: Secondary | ICD-10-CM | POA: Insufficient documentation

## 2018-11-19 DIAGNOSIS — M79671 Pain in right foot: Secondary | ICD-10-CM

## 2018-11-19 DIAGNOSIS — S91331S Puncture wound without foreign body, right foot, sequela: Secondary | ICD-10-CM

## 2018-11-19 DIAGNOSIS — S91339A Puncture wound without foreign body, unspecified foot, initial encounter: Secondary | ICD-10-CM

## 2018-11-19 DIAGNOSIS — Z79899 Other long term (current) drug therapy: Secondary | ICD-10-CM | POA: Insufficient documentation

## 2018-11-19 MED ORDER — IBUPROFEN 600 MG PO TABS: 600.0000 mg | ORAL_TABLET | Freq: Four times a day (QID) | ORAL | 0 refills | Status: DC | PRN

## 2018-11-19 MED ORDER — CIPROFLOXACIN HCL 500 MG PO TABS: 500.0000 mg | ORAL_TABLET | Freq: Two times a day (BID) | ORAL | 0 refills | Status: DC

## 2018-11-19 NOTE — Assessment & Plan Note (Addendum)
Infected puncture wound of right foot which is slowly improving  Plan Extend Cipro 500 mg twice daily for additional 5 days The patient is instructed to wear the arch support that was recommended by podiatry at work Patient is to continue use ibuprofen 4 times daily for the next 2 days on schedule and then as needed thereafter at a 600 mg dose  Refills of both Cipro and ibuprofen were sent to her pharmacy  If she is unimproved she will need to return to podiatry for further evaluation

## 2018-11-19 NOTE — Patient Instructions (Signed)
Continue Cipro twice daily for an additional 5 days beyond her current prescription Take ibuprofen 600 mg 4 times daily for 2 days on schedule then 4 times daily as needed thereafter  Both Cipro and ibuprofen were sent to your [facility] pharmacy  Wear a shoe to work that has softer thicker so for more support  Note a work letter was given so that she can wear shoes that are out of compliance with the dress code  Return if not improving and also consider calling the foot doctor if not improving

## 2018-11-19 NOTE — Progress Notes (Signed)
HFU- stepped on screw and went in her foot on 11/06/2018

## 2018-11-19 NOTE — Progress Notes (Signed)
Subjective:    Patient ID: Dorothy Lopez, female    DOB: 1981-03-14, 37 y.o.   MRN: [id number]  This is a 37 year old female who stepped on a screw piercing her shoe and into the plantar aspect of the midfoot of her right foot.   She was wearing flats with rubber soles, and the screw went through her shoe into the arch of her foot about 1.5 cm.  She reports pain immediately around the puncture site, initially had some bleeding   this injury occurred November 06, 2018 she subsequently went back to the emergency room on November 08, 2018 and from there she self referred to podiatry who saw her 11/14/2018  At the podiatry visit the wound was explored and no abscess or fluid found she was prescribed Cipro 500 mg twice daily for 7 days and she is completing the course of this  She still complains of pain in the right foot but she is not having fever and not having swelling around the foot itself  She has not been wearing supportive shoes at work and she just did return to work today    No past medical history on file.   Family History  Problem Relation Age of Onset  . Kidney disease Maternal Grandmother   . Kidney disease Brother        deceased at age 64  . Diabetes Neg Hx   . Hypertension Neg Hx   . Cancer Neg Hx   . Heart disease Neg Hx      Social History   Socioeconomic History  . Marital status: Single    Spouse name: Not on file  . Number of children: Not on file  . Years of education: Not on file  . Highest education level: Not on file  Occupational History  . Not on file  Social Needs  . Financial resource strain: Not on file  . Food insecurity:    Worry: Not on file    Inability: Not on file  . Transportation needs:    Medical: Not on file    Non-medical: Not on file  Tobacco Use  . Smoking status: Never Smoker  . Smokeless tobacco: Never Used  Substance and Sexual Activity  . Alcohol use: No  . Drug use: No  . Sexual activity: Yes    Partners: Male    Lifestyle  . Physical activity:    Days per week: Not on file    Minutes per session: Not on file  . Stress: Not on file  Relationships  . Social connections:    Talks on phone: Not on file    Gets together: Not on file    Attends religious service: Not on file    Active member of club or organization: Not on file    Attends meetings of clubs or organizations: Not on file    Relationship status: Not on file  . Intimate partner violence:    Fear of current or ex partner: Not on file    Emotionally abused: Not on file    Physically abused: Not on file    Forced sexual activity: Not on file  Other Topics Concern  . Not on file  Social History Narrative  . Not on file     No Known Allergies   Outpatient Medications Prior to Visit  Medication Sig Dispense Refill  . ciprofloxacin (CIPRO) 500 MG tablet Take 1 tablet (500 mg total) by mouth 2 (two) times daily. One po bid  x 7 days 14 tablet 0  . HYDROcodone-acetaminophen (NORCO/VICODIN) 5-325 MG tablet Take 1 tablet by mouth every 4 (four) hours as needed. 10 tablet 0  . ibuprofen (ADVIL,MOTRIN) 600 MG tablet Take 1 tablet (600 mg total) by mouth every 6 (six) hours as needed. 30 tablet 0   Facility-Administered Medications Prior to Visit  Medication Dose Route Frequency Provider Last Rate Last Dose  . medroxyPROGESTERone (DEPO-PROVERA) injection 150 mg  150 mg Intramuscular Q90 days Brock BadHarper, Charles A, MD   150 mg at 09/28/17 1140    Review of Systems  Constitutional: Negative.  Negative for chills and fever.  HENT: Negative.   Eyes: Negative.   Respiratory: Negative.   Cardiovascular: Negative.   Gastrointestinal: Negative.   Endocrine: Negative.   Genitourinary: Negative.   Musculoskeletal: Negative for arthralgias, back pain, gait problem, joint swelling and myalgias.  Neurological: Negative.        Objective:   Physical Exam Vitals:   11/19/18 1609  BP: 134/83  Pulse: 71  Resp: 16  Temp: 98.4 F (36.9 C)   TempSrc: Oral  SpO2: 100%  Weight: 154 lb (69.9 kg)    Gen: Pleasant, well-nourished, in no distress,  normal affect  ENT: No lesions,  mouth clear,  oropharynx clear, no postnasal drip  Neck: No JVD, no TMG, no carotid bruits  Lungs: No use of accessory muscles, no dullness to percussion, clear without rales or rhonchi  Cardiovascular: RRR, heart sounds normal, no murmur or gallops, no peripheral edema  Abdomen: soft and NT, no HSM,  BS normal  Musculoskeletal: No deformities, no cyanosis or clubbing The right foot is examined and shows tenderness on the plantar aspect in the midfoot in the arch region There is mild edema there but no purulence comes from the puncture wound   Neuro: alert, non focal  Skin: Warm, no lesions or rashes  No results found.    Imaging studies of the foot were reviewed    Assessment & Plan:  I personally reviewed all [facility] and [facility] as well as any outside material available during this office visit and agree with the  radiology impressions.   Infected puncture wound of plantar aspect of foot Infected puncture wound of right foot which is slowly improving  Plan Extend Cipro 500 mg twice daily for additional 5 days The patient is instructed to wear the arch support that was recommended by podiatry at work Patient is to continue use ibuprofen 4 times daily for the next 2 days on schedule and then as needed thereafter at a 600 mg dose  Refills of both Cipro and ibuprofen were sent to her pharmacy  If she is unimproved she will need to return to podiatry for further evaluation    Dorothy Lopez was seen today for hospitalization follow-up.  Diagnoses and all orders for this visit:  Puncture wound of plantar aspect of foot without complication, right, sequela  Other orders -     ibuprofen (ADVIL,MOTRIN) 600 MG tablet; Take 1 tablet (600 mg total) by mouth every 6 (six) hours as needed. -     Discontinue: ciprofloxacin  (CIPRO) 500 MG tablet; Take 1 tablet (500 mg total) by mouth 2 (two) times daily. One po bid x 7 days -     ciprofloxacin (CIPRO) 500 MG tablet; Take 1 tablet (500 mg total) by mouth 2 (two) times daily.

## 2018-11-19 NOTE — Progress Notes (Signed)
   Subjective:    Patient ID: Dorothy Lopez, female    DOB: 06-May-1981, 37 y.o.   MRN: [id number]  HPI    Review of Systems     Objective:   Physical Exam        Assessment & Plan:

## 2019-10-18 ENCOUNTER — Ambulatory Visit
Admission: EM | Admit: 2019-10-18 | Discharge: 2019-10-18 | Disposition: A | Discharge status: Home / Self Care | Payer: Medicaid Other | Attending: Physician Assistant | Admitting: Physician Assistant

## 2019-10-18 ENCOUNTER — Other Ambulatory Visit: Payer: Self-pay

## 2019-10-18 DIAGNOSIS — Z113 Encounter for screening for infections with a predominantly sexual mode of transmission: Secondary | ICD-10-CM

## 2019-10-18 NOTE — ED Provider Notes (Signed)
[facility]    CSN: [id number] Arrival date & time: 10/18/19  0930      History   Chief Complaint Chief Complaint  Patient presents with  . SEXUALLY TRANSMITTED DISEASE    HPI Dorothy Lopez is a 38 y.o. female.   38 year old female comes in for STD testing.  She is asymptomatic.  States started a new relationship, and wants to be tested.  Denies fever, chills, body aches.  Denies vaginal discharge, itching, pain.  Denies urinary symptoms such as frequency, dysuria, hematuria.  Denies abdominal pain, nausea, vomiting, diarrhea.  LMP 10/09/2019.     History reviewed. No pertinent past medical history.  Patient Active Problem List   Diagnosis Date Noted  . Infected puncture wound of plantar aspect of foot 11/19/2018  . S/P cesarean section 04/04/2014  . Contraception 02/28/2012    Past Surgical History:  Procedure Laterality Date  . CESAREAN SECTION     x 3  . CESAREAN SECTION N/A 04/04/2014   Procedure: CESAREAN SECTION;  Surgeon: Shelly Bombard, MD;  Location: [facility];  Service: Obstetrics;  Laterality: N/A;  . INDUCED ABORTION  07/2016    OB History    Gravida  6   Para  4   Term  3   Preterm  1   AB  1   Living  5     SAB      TAB  1   Ectopic      Multiple  1   Live Births  5            Home Medications    Prior to Admission medications   Not on File    Family History Family History  Problem Relation Age of Onset  . Kidney disease Maternal Grandmother   . Kidney disease Brother        deceased at age 77  . Diabetes Neg Hx   . Hypertension Neg Hx   . Cancer Neg Hx   . Heart disease Neg Hx     Social History Social History   Tobacco Use  . Smoking status: Never Smoker  . Smokeless tobacco: Never Used  Substance Use Topics  . Alcohol use: No  . Drug use: No     Allergies   Patient has no known allergies.   Review of Systems Review of Systems  Reason unable to perform ROS: See HPI as above.     Physical Exam Triage Vital Signs ED Triage Vitals  Enc Vitals Group     BP 10/18/19 0943 122/77     Pulse Rate 10/18/19 0943 72     Resp 10/18/19 0943 18     Temp 10/18/19 0943 98.3 F (36.8 C)     Temp Source 10/18/19 0943 Oral     SpO2 10/18/19 0943 98 %     Weight --      Height --      Head Circumference --      Peak Flow --      Pain Score 10/18/19 0944 0     Pain Loc --      Pain Edu? --      Excl. in [place]? --    No data found.  Updated Vital Signs BP 122/77 (BP Location: Left Arm)   Pulse 72   Temp 98.3 F (36.8 C) (Oral)   Resp 18   LMP 10/09/2019   SpO2 98%   Physical Exam Constitutional:  General: She is not in acute distress.    Appearance: She is well-developed. She is not diaphoretic.  HENT:     Head: Normocephalic and atraumatic.  Eyes:     Conjunctiva/sclera: Conjunctivae normal.     Pupils: Pupils are equal, round, and reactive to light.  Pulmonary:     Effort: Pulmonary effort is normal. No respiratory distress.  Skin:    General: Skin is warm and dry.  Neurological:     Mental Status: She is alert and oriented to person, place, and time.      UC Treatments / Results  Labs (all labs ordered are listed, but only abnormal results are displayed) Labs Reviewed  HIV ANTIBODY (ROUTINE TESTING W REFLEX)  RPR  CERVICOVAGINAL ANCILLARY ONLY    EKG   Radiology No results found.  Procedures Procedures (including critical care time)  Medications Ordered in UC Medications - No data to display  Initial Impression / Assessment and Plan / UC Course  I have reviewed the triage vital signs and the nursing notes.  Pertinent labs & imaging results that were available during my care of the patient were reviewed by me and considered in my medical decision making (see chart for details).    Cytology, HIV, RPR sent.  Patient to refrain from sexual activity until testing results return.  Return precautions given. Otherwise recheck as needed.   Final Clinical Impressions(s) / UC Diagnoses   Final diagnoses:  Screening for STD (sexually transmitted disease)    ED Prescriptions    None     PDMP not reviewed this encounter.   Belinda Fisher, PA-C 10/18/19 1057

## 2019-10-18 NOTE — ED Triage Notes (Signed)
Pt requesting a STD check, denies any sx's. States had unprotective intercourse on Sunday.

## 2019-10-18 NOTE — Discharge Instructions (Addendum)
Testing sent, you will be contacted with any positive results that requires further treatment. Refrain from sexual activity for the next 7 days. Recheck as needed.

## 2019-10-19 LAB — HIV ANTIBODY (ROUTINE TESTING W REFLEX): HIV Screen 4th Generation wRfx: NONREACTIVE

## 2019-10-19 LAB — RPR: RPR Ser Ql: NONREACTIVE

## 2019-10-22 ENCOUNTER — Telehealth (HOSPITAL_COMMUNITY): Payer: Self-pay | Admitting: Emergency Medicine

## 2019-10-22 LAB — CERVICOVAGINAL ANCILLARY ONLY
Chlamydia: NEGATIVE
Neisseria Gonorrhea: NEGATIVE
Trichomonas: POSITIVE — AB

## 2019-10-22 MED ORDER — METRONIDAZOLE 500 MG PO TABS: 2000.0000 mg | ORAL_TABLET | Freq: Once | ORAL | 0 refills | Status: AC

## 2019-10-22 NOTE — Telephone Encounter (Signed)
Trichomonas is positive. Rx  for Flagyl 2 grams, once was sent to the pharmacy of record. Pt needs education to refrain from sexual intercourse for 7 days to give the medicine time to work. Sexual partners need to be notified and tested/treated. Condoms may reduce risk of reinfection. Recheck for further evaluation if symptoms are not improving.   Patient contacted and made aware of    results, all questions answered    

## 2022-04-16 ENCOUNTER — Inpatient Hospital Stay (HOSPITAL_COMMUNITY): Payer: Medicaid Other

## 2022-04-16 ENCOUNTER — Observation Stay (HOSPITAL_COMMUNITY)
Admission: AD | Admit: 2022-04-16 | Discharge: 2022-04-17 | Disposition: A | Discharge status: Home / Self Care | Payer: Medicaid Other | Attending: Emergency Medicine | Admitting: Emergency Medicine

## 2022-04-16 ENCOUNTER — Encounter (HOSPITAL_COMMUNITY): Payer: Self-pay | Admitting: *Deleted

## 2022-04-16 ENCOUNTER — Other Ambulatory Visit: Payer: Self-pay

## 2022-04-16 DIAGNOSIS — D62 Acute posthemorrhagic anemia: Secondary | ICD-10-CM | POA: Diagnosis not present

## 2022-04-16 DIAGNOSIS — O034 Incomplete spontaneous abortion without complication: Secondary | ICD-10-CM

## 2022-04-16 DIAGNOSIS — O0289 Other abnormal products of conception: Secondary | ICD-10-CM | POA: Diagnosis not present

## 2022-04-16 DIAGNOSIS — Z79899 Other long term (current) drug therapy: Secondary | ICD-10-CM | POA: Insufficient documentation

## 2022-04-16 DIAGNOSIS — O4690 Antepartum hemorrhage, unspecified, unspecified trimester: Secondary | ICD-10-CM

## 2022-04-16 DIAGNOSIS — N711 Chronic inflammatory disease of uterus: Secondary | ICD-10-CM | POA: Diagnosis not present

## 2022-04-16 DIAGNOSIS — O074 Failed attempted termination of pregnancy without complication: Secondary | ICD-10-CM | POA: Diagnosis present

## 2022-04-16 DIAGNOSIS — N939 Abnormal uterine and vaginal bleeding, unspecified: Secondary | ICD-10-CM | POA: Diagnosis present

## 2022-04-16 LAB — CBC WITH DIFFERENTIAL/PLATELET
Abs Immature Granulocytes: 0.05 10*3/uL (ref 0.00–0.07)
Basophils Absolute: 0.1 10*3/uL (ref 0.0–0.1)
Basophils Relative: 1 %
Eosinophils Absolute: 0.2 10*3/uL (ref 0.0–0.5)
Eosinophils Relative: 2 %
HCT: 19 % — ABNORMAL LOW (ref 36.0–46.0)
Hemoglobin: 5.9 g/dL — CL (ref 12.0–15.0)
Immature Granulocytes: 0 %
Lymphocytes Relative: 11 %
Lymphs Abs: 1.4 10*3/uL (ref 0.7–4.0)
MCH: 24.4 pg — ABNORMAL LOW (ref 26.0–34.0)
MCHC: 31.1 g/dL (ref 30.0–36.0)
MCV: 78.5 fL — ABNORMAL LOW (ref 80.0–100.0)
Monocytes Absolute: 0.5 10*3/uL (ref 0.1–1.0)
Monocytes Relative: 4 %
Neutro Abs: 10.3 10*3/uL — ABNORMAL HIGH (ref 1.7–7.7)
Neutrophils Relative %: 82 %
Platelets: 357 10*3/uL (ref 150–400)
RBC: 2.42 MIL/uL — ABNORMAL LOW (ref 3.87–5.11)
RDW: 16 % — ABNORMAL HIGH (ref 11.5–15.5)
WBC: 12.5 10*3/uL — ABNORMAL HIGH (ref 4.0–10.5)
nRBC: 0 % (ref 0.0–0.2)

## 2022-04-16 LAB — COMPREHENSIVE METABOLIC PANEL
ALT: 14 U/L (ref 0–44)
AST: 15 U/L (ref 15–41)
Albumin: 3.4 g/dL — ABNORMAL LOW (ref 3.5–5.0)
Alkaline Phosphatase: 47 U/L (ref 38–126)
Anion gap: 6 (ref 5–15)
BUN: 11 mg/dL (ref 6–20)
CO2: 23 mmol/L (ref 22–32)
Calcium: 9 mg/dL (ref 8.9–10.3)
Chloride: 106 mmol/L (ref 98–111)
Creatinine, Ser: 0.74 mg/dL (ref 0.44–1.00)
GFR, Estimated: >60 mL/min (ref >60)
Glucose, Bld: 95 mg/dL (ref 70–99)
Potassium: 3.8 mmol/L (ref 3.5–5.1)
Sodium: 135 mmol/L (ref 135–145)
Total Bilirubin: 0.5 mg/dL (ref 0.3–1.2)
Total Protein: 7.7 g/dL (ref 6.5–8.1)

## 2022-04-16 LAB — TYPE AND SCREEN
ABO/RH(D): O POS
Antibody Screen: NEGATIVE

## 2022-04-16 LAB — PREPARE RBC (CROSSMATCH)

## 2022-04-16 LAB — HCG, QUANTITATIVE, PREGNANCY: hCG, Beta Chain, Quant, S: 5812 m[IU]/mL — ABNORMAL HIGH (ref <5)

## 2022-04-16 MED ORDER — POLYETHYLENE GLYCOL 3350 17 G PO PACK: 17.0000 g | PACK | Freq: Every day | ORAL | Status: DC | PRN

## 2022-04-16 MED ORDER — MISOPROSTOL 200 MCG PO TABS
1000.0000 ug | ORAL_TABLET | Freq: Once | ORAL | Status: AC
Start: 2022-04-16 — End: 2022-04-17
  Administered 2022-04-17: 1000 ug via RECTAL
  Filled 2022-04-16: qty 5

## 2022-04-16 MED ORDER — TRANEXAMIC ACID 650 MG PO TABS
1300.0000 mg | ORAL_TABLET | Freq: Three times a day (TID) | ORAL | Status: DC
  Administered 2022-04-17 (×2): 1300 mg via ORAL
  Filled 2022-04-16 (×5): qty 2

## 2022-04-16 MED ORDER — SODIUM CHLORIDE 0.9 % IV BOLUS
1000.0000 mL | Freq: Once | INTRAVENOUS | Status: AC
  Administered 2022-04-16: 1000 mL via INTRAVENOUS

## 2022-04-16 MED ORDER — ALUM & MAG HYDROXIDE-SIMETH 200-200-20 MG/5ML PO SUSP: 30.0000 mL | ORAL | Status: DC | PRN

## 2022-04-16 MED ORDER — IBUPROFEN 600 MG PO TABS: 600.0000 mg | ORAL_TABLET | Freq: Four times a day (QID) | ORAL | Status: DC | PRN

## 2022-04-16 MED ORDER — HYDROCODONE-ACETAMINOPHEN 5-325 MG PO TABS: 1.0000 | ORAL_TABLET | ORAL | Status: DC | PRN

## 2022-04-16 MED ORDER — SODIUM CHLORIDE 0.9% IV SOLUTION: Freq: Once | INTRAVENOUS | Status: AC

## 2022-04-16 MED ORDER — MENTHOL 3 MG MT LOZG: 1.0000 | LOZENGE | OROMUCOSAL | Status: DC | PRN

## 2022-04-16 MED ORDER — METOCLOPRAMIDE HCL 5 MG/ML IJ SOLN: 10.0000 mg | Freq: Four times a day (QID) | INTRAMUSCULAR | Status: DC | PRN

## 2022-04-16 MED ORDER — GUAIFENESIN 100 MG/5ML PO LIQD: 15.0000 mL | ORAL | Status: DC | PRN

## 2022-04-16 NOTE — H&P (Signed)
Dorothy Lopez is an 41 y.o. [phone] female.   ?Chief Complaint: bleeding ?HPI: patient is a [id number] who underwent medical TOP on 4/10 following u/s showing 7 wk 4 day IUP. Bled some then and stopped. Still having unprotected intercourse and took a plan B this week. Began bleeding on 4/27 and bled heavily. Came to ED today. ? ?History reviewed. No pertinent past medical history. ? ?Past Surgical History:  ?Procedure Laterality Date  ? CESAREAN SECTION    ? x 3  ? CESAREAN SECTION N/A 04/04/2014  ? Procedure: CESAREAN SECTION;  Surgeon: Brock Bad, MD;  Location: [facility];  Service: Obstetrics;  Laterality: N/A;  ? INDUCED ABORTION  07/2016  ? ? ?Family History  ?Problem Relation Age of Onset  ? Kidney disease Maternal Grandmother   ? Kidney disease Brother   ?     deceased at age 70  ? Diabetes Neg Hx   ? Hypertension Neg Hx   ? Cancer Neg Hx   ? Heart disease Neg Hx   ? ?Social History:  reports that she has never smoked. She has never used smokeless tobacco. She reports that she does not drink alcohol and does not use drugs. ? ?Allergies: No Known Allergies ? ?Facility-Administered Medications Prior to Admission  ?Medication Dose Route Frequency Provider Last Rate Last Admin  ? medroxyPROGESTERone (DEPO-PROVERA) injection 150 mg  150 mg Intramuscular Q90 days Brock Bad, MD   150 mg at 09/28/17 1140  ? ?Medications Prior to Admission  ?Medication Sig Dispense Refill  ? acetaminophen (TYLENOL) 500 MG tablet Take 500 mg by mouth every 6 (six) hours as needed for moderate pain.    ? ? ?Constitutional: positive for dizziness when standing  otherwise ROS is negative. No longer bleeding following removal of clot ? ?Blood pressure 120/60, pulse 94, temperature 98.5 ?F (36.9 ?C), resp. rate 20, height 5\' 1"  (1.549 m), weight 69.4 kg, SpO2 99 %. ?General appearance: alert, cooperative, appears stated age, and pale ?Head: Normocephalic, without obvious abnormality, atraumatic ?Neck: supple, symmetrical, trachea  midline ?Lungs:  normal effort ?Heart: regular rate and rhythm ?Abdomen: soft, non-tender; bowel sounds normal; no masses,  no organomegaly ?Pelvic: external genitalia normal, no adnexal masses or tenderness, no cervical motion tenderness, rectovaginal septum normal, vagina normal without discharge, and uterus is enlarged, cervix is 1 cm dilated. Large clot in vagina removed ?Extremities: Homans sign is negative, no sign of DVT ?Skin: Skin color, texture, turgor normal. No rashes or lesions ?Neurologic: Grossly normal ? ? ?Results for orders placed or performed during the hospital encounter of 04/16/22 (from the past 24 hour(s))  ?CBC with Differential     Status: Abnormal  ? Collection Time: 04/16/22  4:22 PM  ?Result Value Ref Range  ? WBC 12.5 (H) 4.0 - 10.5 K/uL  ? RBC 2.42 (L) 3.87 - 5.11 MIL/uL  ? Hemoglobin 5.9 (LL) 12.0 - 15.0 g/dL  ? HCT 19.0 (L) 36.0 - 46.0 %  ? MCV 78.5 (L) 80.0 - 100.0 fL  ? MCH 24.4 (L) 26.0 - 34.0 pg  ? MCHC 31.1 30.0 - 36.0 g/dL  ? RDW 16.0 (H) 11.5 - 15.5 %  ? Platelets 357 150 - 400 K/uL  ? nRBC 0.0 0.0 - 0.2 %  ? Neutrophils Relative % 82 %  ? Neutro Abs 10.3 (H) 1.7 - 7.7 K/uL  ? Lymphocytes Relative 11 %  ? Lymphs Abs 1.4 0.7 - 4.0 K/uL  ? Monocytes Relative 4 %  ? Monocytes  Absolute 0.5 0.1 - 1.0 K/uL  ? Eosinophils Relative 2 %  ? Eosinophils Absolute 0.2 0.0 - 0.5 K/uL  ? Basophils Relative 1 %  ? Basophils Absolute 0.1 0.0 - 0.1 K/uL  ? Immature Granulocytes 0 %  ? Abs Immature Granulocytes 0.05 0.00 - 0.07 K/uL  ?Comprehensive metabolic panel     Status: Abnormal  ? Collection Time: 04/16/22  4:22 PM  ?Result Value Ref Range  ? Sodium 135 135 - 145 mmol/L  ? Potassium 3.8 3.5 - 5.1 mmol/L  ? Chloride 106 98 - 111 mmol/L  ? CO2 23 22 - 32 mmol/L  ? Glucose, Bld 95 70 - 99 mg/dL  ? BUN 11 6 - 20 mg/dL  ? Creatinine, Ser 0.74 0.44 - 1.00 mg/dL  ? Calcium 9.0 8.9 - 10.3 mg/dL  ? Total Protein 7.7 6.5 - 8.1 g/dL  ? Albumin 3.4 (L) 3.5 - 5.0 g/dL  ? AST 15 15 - 41 U/L  ? ALT 14 0  - 44 U/L  ? Alkaline Phosphatase 47 38 - 126 U/L  ? Total Bilirubin 0.5 0.3 - 1.2 mg/dL  ? GFR, Estimated >60 >60 mL/min  ? Anion gap 6 5 - 15  ?hCG, quantitative, pregnancy     Status: Abnormal  ? Collection Time: 04/16/22  4:22 PM  ?Result Value Ref Range  ? hCG, Beta Chain, Quant, S 5,812 (H) <5 mIU/mL  ?Type and screen [facility]     Status: None  ? Collection Time: 04/16/22  4:22 PM  ?Result Value Ref Range  ? ABO/RH(D) O POS   ? Antibody Screen NEG   ? Sample Expiration    ?  04/19/2022,2359 ?Performed at [facility], [address] [place], [place] [address] ?  ?Prepare RBC (crossmatch)     Status: None  ? Collection Time: 04/16/22  8:02 PM  ?Result Value Ref Range  ? Order Confirmation    ?  ORDER PROCESSED BY BLOOD BANK ?Performed at [facility], [address] [place], [place] [address] ?  ? ?US OB Comp Less 14 Wks ? ?Result Date: 04/16/2022 ?CLINICAL DATA:  Heavy vaginal bleeding and dizziness today. Technologist notes state there pubic medication abortion on 04/10. beta HCG 5,812 EXAM: OBSTETRIC <14 WK US AND TRANSVAGINAL OB US TECHNIQUE: Both transabdominal and transvaginal ultrasound examinations were performed for complete evaluation of the gestation as well as the maternal uterus, adnexal regions, and pelvic cul-de-sac. Transvaginal technique was performed to assess early pregnancy. COMPARISON:  None. FINDINGS: Intrauterine gestational sac: None Yolk sac:  Not Visualized. Embryo:  Not Visualized. Maternal uterus/adnexae: The uterus is anteverted on transabdominal imaging but appears retroverted on transvaginal scanning. There is no intrauterine gestational sac. The endometrium measures 20 mm, heterogeneous with increased vascularity. Shadowing foci in the endometrium may represent air in the endometrial canal. Both ovaries are visualized, there is a physiologic corpus luteal cyst on the right. No adnexal mass. No pelvic free fluid. IMPRESSION: 1. No  intrauterine gestation or findings of acute optic pregnancy. 2. Thickened heterogeneous endometrium with vascularity, consistent with retained products of conception in the appropriate clinical setting. There are linear shadowing foci in the endometrial canal it may represent air. Recommend clinical correlation for endometritis. 3. Normal sonographic appearance of the ovaries.  No adnexal mass. Electronically Signed   By: Narda RutherfordMelanie  Sanford M.D.   On: 04/16/2022 21:12  ? ?US OB Transvaginal ? ?Result Date: 04/16/2022 ?CLINICAL DATA:  Heavy vaginal bleeding  and dizziness today. Technologist notes state there pubic medication abortion on 04/10. beta HCG 5,812 EXAM: OBSTETRIC <14 WK [place] AND TRANSVAGINAL OB US TECHNIQUE: Both transabdominal and transvaginal ultrasound examinations were performed for complete evaluation of the gestation as well as the maternal uterus, adnexal regions, and pelvic cul-de-sac. Transvaginal technique was performed to assess early pregnancy. COMPARISON:  None. FINDINGS: Intrauterine gestational sac: None Yolk sac:  Not Visualized. Embryo:  Not Visualized. Maternal uterus/adnexae: The uterus is anteverted on transabdominal imaging but appears retroverted on transvaginal scanning. There is no intrauterine gestational sac. The endometrium measures 20 mm, heterogeneous with increased vascularity. Shadowing foci in the endometrium may represent air in the endometrial canal. Both ovaries are visualized, there is a physiologic corpus luteal cyst on the right. No adnexal mass. No pelvic free fluid. IMPRESSION: 1. No intrauterine gestation or findings of acute optic pregnancy. 2. Thickened heterogeneous endometrium with vascularity, consistent with retained products of conception in the appropriate clinical setting. There are linear shadowing foci in the endometrial canal it may represent air. Recommend clinical correlation for endometritis. 3. Normal sonographic appearance of the ovaries.  No adnexal  mass. Electronically Signed   By: Narda Rutherford M.D.   On: 04/16/2022 21:12   ? ? ?Assessment/Plan ?Acute blood loss anemia ? ?Vaginal bleeding in pregnancy ? ?Retained products of conception following abo

## 2022-04-16 NOTE — MAU Note (Signed)
Patient arrived via EMS transport from [facility] hospital. Patient reports having an abortion on 4/10. ? ?She took abortion pill on 4/10 and 4/11. [place] was done at the abortion clinic and confirmed that she was 7w 4d gestation. Patient states that she had bleeding and cramps on 4/11 and over the course of 2 weeks bleeding became lighter. On 4/23 patient reports having vaginal intercourse and took a plan B on the 4/24. On 4/26 and 4/27 patient reports heavy bleeding The bleeding continued today with patient feeling a big gush of blood. Patient became dizzy and lightheaded so she laid down for about 1 hr and called 911. Patient's admission vital signs are WNL. Patient is currently feeling lightheaded and dizziness.  ?

## 2022-04-16 NOTE — MAU Provider Note (Signed)
Patient Dorothy Lopez is a 41 y.o. [id number] ? At Unknown here with complaints for vaginal bleeding that started on Thursday, 4-27 (two days ago). She recently had a medication abortion on April 10-11 and had vaginal bleeding, which was what she expected (she has had MAB before). She had intercourse on Thursday, April 27 and then took a PLan B as extra protection. She started bleeding on Thursday and has been bleeding "constantly" since then. She denies abdominal pain, nv, chest pain, SOB. She reports dizziness.  ? ?At [facility] she refused exam, [place], blood products. Her beta HCG was over 5,000 and she was then transferred to [facility] for evaluation.  ?History  ?  ? ?CSN: [id number] ? ?Arrival date and time: 04/16/22 1452 ? ? Event Date/Time  ? First Provider Initiated Contact with Patient 04/16/22 1952   ?  ? ?Chief Complaint  ?Patient presents with  ? Vaginal Bleeding  ? ?Vaginal Bleeding ?The patient's primary symptoms include vaginal bleeding. The current episode started in the past 7 days. The problem occurs constantly. The patient is experiencing no pain. She is not pregnant. Associated symptoms include abdominal pain. Pertinent negatives include no constipation, diarrhea, fever, nausea, urgency or vomiting. The vaginal discharge was bloody. The vaginal bleeding is heavier than menses. She has been passing clots. Nothing aggravates the symptoms. She has tried nothing for the symptoms.  ? ?OB History   ? ? Gravida  ?7  ? Para  ?4  ? Term  ?3  ? Preterm  ?1  ? AB  ?1  ? Living  ?5  ?  ? ? SAB  ?   ? IAB  ?1  ? Ectopic  ?   ? Multiple  ?1  ? Live Births  ?5  ?   ?  ?  ? ? ?History reviewed. No pertinent past medical history. ? ?Past Surgical History:  ?Procedure Laterality Date  ? CESAREAN SECTION    ? x 3  ? CESAREAN SECTION N/A 04/04/2014  ? Procedure: CESAREAN SECTION;  Surgeon: Brock Bad, MD;  Location: [facility];  Service: Obstetrics;  Laterality: N/A;  ? INDUCED ABORTION  07/2016  ? ? ?Family History  ?Problem Relation  Age of Onset  ? Kidney disease Maternal Grandmother   ? Kidney disease Brother   ?     deceased at age 75  ? Diabetes Neg Hx   ? Hypertension Neg Hx   ? Cancer Neg Hx   ? Heart disease Neg Hx   ? ? ?Social History  ? ?Tobacco Use  ? Smoking status: Never  ? Smokeless tobacco: Never  ?Vaping Use  ? Vaping Use: Never used  ?Substance Use Topics  ? Alcohol use: No  ? Drug use: No  ? ? ?Allergies: No Known Allergies ? ?Facility-Administered Medications Prior to Admission  ?Medication Dose Route Frequency Provider Last Rate Last Admin  ? medroxyPROGESTERone (DEPO-PROVERA) injection 150 mg  150 mg Intramuscular Q90 days Brock Bad, MD   150 mg at 09/28/17 1140  ? ?Medications Prior to Admission  ?Medication Sig Dispense Refill Last Dose  ? acetaminophen (TYLENOL) 500 MG tablet Take 500 mg by mouth every 6 (six) hours as needed for moderate pain.   04/15/2022  ? ? ?Review of Systems  ?Constitutional:  Negative for fever.  ?Respiratory: Negative.    ?Cardiovascular: Negative.   ?Gastrointestinal:  Positive for abdominal pain. Negative for constipation, diarrhea, nausea and vomiting.  ?Genitourinary:  Positive for vaginal bleeding. Negative for  urgency.  ?Musculoskeletal: Negative.   ?Neurological: Negative.   ?Hematological: Negative.   ?Psychiatric/Behavioral: Negative.    ?Physical Exam  ? ?Blood pressure 133/69, pulse (!) 103, temperature 98.5 ?F (36.9 ?C), resp. rate 20, height 5\' 1"  (1.549 m), weight 69.4 kg, SpO2 100 %. ? ?Physical Exam ?Constitutional:   ?   Appearance: Normal appearance.  ?Cardiovascular:  ?   Rate and Rhythm: Normal rate.  ?Pulmonary:  ?   Effort: Pulmonary effort is normal.  ?Genitourinary: ?   General: Normal vulva.  ?   Comments: NEFG; large blood clot in vagina on speculum exam; once clot was removed with speculum she did not have any active bleeding; her abdomen is soft, non-tender. Cervix is 1 cm dilated, no CMT.  ?Musculoskeletal:     ?   General: Normal range of motion.   ?Neurological:  ?   General: No focal deficit present.  ?   Mental Status: She is alert.  ? ? ?MAU Course  ?Procedures ? ?MDM ?-In to see patient immediately, given concern for bleeding.  ?Patient is alert and oriented x 4 in the bed, able to give history and answer questions. SHe denies pain at this time.  ?-1958: Updated Dr. on patient's Hgb, pelvic exam, VSS. Will send for Shawnie Pons and prepare to transfuse 2 Units of blood.  ?-patient to be kept NPO at this time.  ? ?Reassessment (9:23 PM) ?-patient refusing blood transfusion at this time; Dr. Korea in to talk to patient about results and need for transfusion.  ? ?Assessment and Plan  ?-patient care endorsed to Dr. Shawnie Pons at 2123 ? ?2124 Dorothy Lopez ?04/16/2022, 8:04 PM  ?

## 2022-04-16 NOTE — ED Triage Notes (Signed)
Plan B taken on April 25th, bleeding started and is getting heavier with some clots. April 10th abortion at clinic. ?

## 2022-04-16 NOTE — ED Provider Notes (Addendum)
?[facility]-EMERGENCY DEPT ?Provider Note ? ? ?CSN: [id number] ?Arrival date & time: 04/16/22  1452 ? ?  ?History ? ?Chief Complaint  ?Patient presents with  ? Vaginal Bleeding  ? ? ?Dorothy Lopez is a 41 y.o. female here for evaluation of vaginal bleeding.  Began on Thursday.  Changing 1 pad about every hour.  Feels generally fatigued and weak.  Denies chance of current pregnancy however did state she visited a women's clinic on the 10th and was told she was 7 weeks 2 days pregnant.  Was given abortion pill, took second dose on the 11th.  Patient states she had some bleeding and cramping after taking her pill which resolved.  Had unprotected sex on April 24 subsequently took Plan B on the 25th.  No headache, chest pain, shortness of breath.  No fever, abdominal pain, dysuria, hematuria, vaginal discharge.  She is not on any form of birth control. Not followed by Obgyn. ? ?HPI ? ?  ? ?Home Medications ?Prior to Admission medications   ?Medication Sig Start Date End Date Taking? Authorizing Provider  ?acetaminophen (TYLENOL) 500 MG tablet Take 500 mg by mouth every 6 (six) hours as needed for moderate pain.   Yes [provider]  ?   ? ?Allergies    ?Patient has no known allergies.   ? ?Review of Systems   ?Review of Systems  ?Constitutional:  Positive for fatigue.  ?HENT: Negative.    ?Respiratory: Negative.    ?Cardiovascular: Negative.   ?Gastrointestinal: Negative.   ?Genitourinary:  Positive for menstrual problem and vaginal bleeding. Negative for decreased urine volume, difficulty urinating, dyspareunia, dysuria, flank pain, frequency, hematuria, pelvic pain, urgency, vaginal discharge and vaginal pain.  ?Musculoskeletal: Negative.   ?Skin: Negative.   ?Neurological:  Positive for weakness (generalized). Negative for dizziness, syncope, light-headedness, numbness and headaches.  ?All other systems reviewed and are negative. ? ?Physical Exam ?Updated Vital Signs ?BP 109/62   Pulse  96   Temp 98.6 ?F (37 ?C) (Oral)   Resp 20   Ht 5\' 1"  (1.549 m)   Wt 69.4 kg   SpO2 100%   BMI 28.91 kg/m?  ?Physical Exam ?Vitals and nursing note reviewed.  ?Constitutional:   ?   General: She is not in acute distress. ?   Appearance: She is well-developed. She is not ill-appearing, toxic-appearing or diaphoretic.  ?HENT:  ?   Head: Normocephalic and atraumatic.  ?   Nose: Nose normal.  ?   Mouth/Throat:  ?   Mouth: Mucous membranes are moist.  ?Eyes:  ?   Pupils: Pupils are equal, round, and reactive to light.  ?   Comments: Conjunctival pallor  ?Cardiovascular:  ?   Rate and Rhythm: Normal rate.  ?   Pulses: Normal pulses.  ?   Heart sounds: Normal heart sounds.  ?Pulmonary:  ?   Effort: Pulmonary effort is normal. No respiratory distress.  ?   Breath sounds: Normal breath sounds.  ?Abdominal:  ?   General: Bowel sounds are normal. There is no distension.  ?   Palpations: Abdomen is soft.  ?   Tenderness: There is no abdominal tenderness. There is no right CVA tenderness, left CVA tenderness, guarding or rebound.  ?   Comments: Soft nontender. No rebound or guarding.  ?Genitourinary: ?   Comments: DECLINED ?Musculoskeletal:     ?   General: No swelling, tenderness, deformity or signs of injury. Normal range of motion.  ?   Cervical back:  Normal range of motion.  ?   Right lower leg: No edema.  ?   Left lower leg: No edema.  ?Skin: ?   General: Skin is warm and dry.  ?   Capillary Refill: Capillary refill takes less than 2 seconds.  ?   Coloration: Skin is pale.  ?Neurological:  ?   General: No focal deficit present.  ?   Mental Status: She is alert and oriented to person, place, and time.  ?Psychiatric:     ?   Mood and Affect: Mood normal.  ? ? ?ED Results / Procedures / Treatments   ?Labs ?(all labs ordered are listed, but only abnormal results are displayed) ?Labs Reviewed  ?CBC WITH DIFFERENTIAL/PLATELET - Abnormal; Notable for the following components:  ?    Result Value  ? WBC 12.5 (*)   ? RBC 2.42  (*)   ? Hemoglobin 5.9 (*)   ? HCT 19.0 (*)   ? MCV 78.5 (*)   ? MCH 24.4 (*)   ? RDW 16.0 (*)   ? Neutro Abs 10.3 (*)   ? All other components within normal limits  ?COMPREHENSIVE METABOLIC PANEL - Abnormal; Notable for the following components:  ? Albumin 3.4 (*)   ? All other components within normal limits  ?HCG, QUANTITATIVE, PREGNANCY - Abnormal; Notable for the following components:  ? hCG, Beta Chain, Quant, S 5,812 (*)   ? All other components within normal limits  ?URINALYSIS, ROUTINE W REFLEX MICROSCOPIC  ?TYPE AND SCREEN  ? ? ?EKG ?None ? ?Radiology ?No results found. ? ?Procedures ?Marland KitchenCritical Care ?Performed by: Linwood Dibbles, PA-C ?Authorized by: Linwood Dibbles, PA-C  ? ?Critical care provider statement:  ?  Critical care time (minutes):  35 ?  Critical care was time spent personally by me on the following activities:  Development of treatment plan with patient or surrogate, discussions with consultants, evaluation of patient's response to treatment, examination of patient, ordering and review of laboratory studies, ordering and review of radiographic studies, ordering and performing treatments and interventions, pulse oximetry, re-evaluation of patient's condition and review of old charts  ? ? ?Medications Ordered in ED ?Medications  ?sodium chloride 0.9 % bolus 1,000 mL (1,000 mLs Intravenous New Bag/Given 04/16/22 1852)  ? ? ?ED Course/ Medical Decision Making/ A&P ?  ? ?41 year old here for evaluation of vaginal bleeding, fatigue, lightheadedness and generalized weakness.  Apparently took 2 abortion pills earlier on this month (4/10 and 4/11), swelling had sexual intercourse 4/23 took morning-after pill 4/24.  Has some continued generalized weakness, bleeding beginning Thursday. Changing her pad approximately once an hour. ? ?Labs and imaging personally viewed and interpreted: ? ?CBC leukocytosis 12.5, hemoglobin 5.9 ?BMP no significant abnormality ?EKG ischemia ?Hcg Quant  5812 ? ? ?Discussed results with patient.  Recommended pelvic exam here as well as ultrasound and blood transfusion.  Patient declined imaging and pelvic exam here in ED. PATIENT DECLINED BLOOD TRANSFUSION as she states her "brother had a reaction to it."  I discussed risk versus benefit to include hemorrhaging, ectopic pregnancy, retained products of conception, life or limb threatening condition, worsening anemia. Her reasoning is " they will just have to do it again over there [facility])."    ? ?Patient continues to decline stating she does not want this done at [facility] long and would like to see the OB/GYN at [facility] prior to further assessment.  Discussed the time sensitive nature of this testing/ treatment however patient continues to decline.  Will discuss with OB[facility] for transfer ? ?CONSULT with Dr. Shawnie Pons with Ob/GYN.  We discussed patient's labs, VS as well as patient's refusal for imaging, pelvic and blood transfusion here.  Agreeable to accept patient in transfer to MAU.  Will be taken via [facility]. ? ?Discussed transfer with patient to MAU.  She is agreeable.  She continues to decline any imaging, pelvic exam as well as blood transfusion here in the emergency department. She understands risk versus benefit of declining further imaging, treatment in ED today. ? ?                        ?Medical Decision Making ?Amount and/or Complexity of Data Reviewed ?External Data Reviewed: labs, radiology and notes. ?Labs: ordered. Decision-making details documented in ED Course. ? ?Risk ?OTC drugs. ?Drug therapy requiring intensive monitoring for toxicity. ?Decision regarding hospitalization. ?Diagnosis or treatment significantly limited by social determinants of health. ? ? ? ? ? ? ? ? ?Final Clinical Impression(s) / ED Diagnoses ?Final diagnoses:  ?Symptomatic anemia  ?Vaginal bleeding in pregnancy  ? ? ?Rx / DC Orders ?ED Discharge Orders   ? ? None  ? ?  ? ? ?  ?Monserat Prestigiacomo A, PA-C ?04/16/22 1908 ? ?  ?Celestina Gironda,  Pailynn Vahey A, PA-C ?04/16/22 1911 ? ?  ?Gloris Manchester, MD ?04/19/22 1455 ? ?

## 2022-04-17 ENCOUNTER — Observation Stay (HOSPITAL_COMMUNITY): Payer: Medicaid Other

## 2022-04-17 ENCOUNTER — Observation Stay (HOSPITAL_COMMUNITY): Payer: Medicaid Other | Admitting: Anesthesiology

## 2022-04-17 ENCOUNTER — Other Ambulatory Visit: Payer: Self-pay

## 2022-04-17 ENCOUNTER — Observation Stay (HOSPITAL_BASED_OUTPATIENT_CLINIC_OR_DEPARTMENT_OTHER): Payer: Medicaid Other | Admitting: Anesthesiology

## 2022-04-17 ENCOUNTER — Encounter (HOSPITAL_COMMUNITY): Admission: AD | Disposition: A | Discharge status: Home / Self Care | Payer: Self-pay | Source: Home / Self Care

## 2022-04-17 ENCOUNTER — Encounter (HOSPITAL_COMMUNITY): Payer: Self-pay | Admitting: Family Medicine

## 2022-04-17 DIAGNOSIS — O021 Missed abortion: Secondary | ICD-10-CM

## 2022-04-17 DIAGNOSIS — Z3A01 Less than 8 weeks gestation of pregnancy: Secondary | ICD-10-CM | POA: Diagnosis not present

## 2022-04-17 HISTORY — PX: DILATION AND EVACUATION: SHX1459

## 2022-04-17 LAB — CBC
HCT: 27.4 % — ABNORMAL LOW (ref 36.0–46.0)
Hemoglobin: 8.7 g/dL — ABNORMAL LOW (ref 12.0–15.0)
MCH: 25.9 pg — ABNORMAL LOW (ref 26.0–34.0)
MCHC: 31.8 g/dL (ref 30.0–36.0)
MCV: 81.5 fL (ref 80.0–100.0)
Platelets: 297 10*3/uL (ref 150–400)
RBC: 3.36 MIL/uL — ABNORMAL LOW (ref 3.87–5.11)
RDW: 16.2 % — ABNORMAL HIGH (ref 11.5–15.5)
WBC: 7.7 10*3/uL (ref 4.0–10.5)
nRBC: 0 % (ref 0.0–0.2)

## 2022-04-17 SURGERY — DILATION AND EVACUATION, UTERUS
Anesthesia: Monitor Anesthesia Care | Site: Vagina

## 2022-04-17 MED ORDER — MIDAZOLAM HCL 2 MG/2ML IJ SOLN
INTRAMUSCULAR | Status: AC
  Filled 2022-04-17: qty 2

## 2022-04-17 MED ORDER — KETOROLAC TROMETHAMINE 30 MG/ML IJ SOLN
INTRAMUSCULAR | Status: AC
  Filled 2022-04-17: qty 1

## 2022-04-17 MED ORDER — CEFAZOLIN SODIUM-DEXTROSE 2-3 GM-%(50ML) IV SOLR
INTRAVENOUS | Status: DC | PRN
  Administered 2022-04-17: 2 g via INTRAVENOUS

## 2022-04-17 MED ORDER — 0.9 % SODIUM CHLORIDE (POUR BTL) OPTIME
TOPICAL | Status: DC | PRN
  Administered 2022-04-17: 1000 mL

## 2022-04-17 MED ORDER — ONDANSETRON 8 MG PO TBDP
8.0000 mg | ORAL_TABLET | Freq: Three times a day (TID) | ORAL | 0 refills | Status: AC | PRN
Start: 2022-04-17 — End: ?

## 2022-04-17 MED ORDER — AMISULPRIDE (ANTIEMETIC) 5 MG/2ML IV SOLN: 10.0000 mg | Freq: Once | INTRAVENOUS | Status: DC | PRN

## 2022-04-17 MED ORDER — PROPOFOL 10 MG/ML IV BOLUS
INTRAVENOUS | Status: AC
  Filled 2022-04-17: qty 20

## 2022-04-17 MED ORDER — CHLORHEXIDINE GLUCONATE 0.12 % MT SOLN: 15.0000 mL | Freq: Once | OROMUCOSAL | Status: AC

## 2022-04-17 MED ORDER — PROPOFOL 10 MG/ML IV BOLUS
INTRAVENOUS | Status: DC | PRN
  Administered 2022-04-17: 20 mg via INTRAVENOUS
  Administered 2022-04-17: 30 mg via INTRAVENOUS

## 2022-04-17 MED ORDER — METHYLERGONOVINE MALEATE 0.2 MG/ML IJ SOLN
INTRAMUSCULAR | Status: AC
  Filled 2022-04-17: qty 1

## 2022-04-17 MED ORDER — CEFAZOLIN SODIUM 1 G IJ SOLR
INTRAMUSCULAR | Status: AC
  Filled 2022-04-17: qty 20

## 2022-04-17 MED ORDER — DEXAMETHASONE SODIUM PHOSPHATE 10 MG/ML IJ SOLN
INTRAMUSCULAR | Status: DC | PRN
Start: 2022-04-17 — End: 2022-04-17
  Administered 2022-04-17: 5 mg via INTRAVENOUS

## 2022-04-17 MED ORDER — DEXAMETHASONE SODIUM PHOSPHATE 10 MG/ML IJ SOLN
INTRAMUSCULAR | Status: AC
  Filled 2022-04-17: qty 1

## 2022-04-17 MED ORDER — MIDAZOLAM HCL 2 MG/2ML IJ SOLN
INTRAMUSCULAR | Status: DC | PRN
  Administered 2022-04-17: 2 mg via INTRAVENOUS

## 2022-04-17 MED ORDER — KETOROLAC TROMETHAMINE 10 MG PO TABS: 10.0000 mg | ORAL_TABLET | Freq: Three times a day (TID) | ORAL | 0 refills | Status: AC | PRN

## 2022-04-17 MED ORDER — ONDANSETRON HCL 4 MG/2ML IJ SOLN
INTRAMUSCULAR | Status: AC
  Filled 2022-04-17: qty 2

## 2022-04-17 MED ORDER — LIDOCAINE 2% (20 MG/ML) 5 ML SYRINGE
INTRAMUSCULAR | Status: DC | PRN
  Administered 2022-04-17: 60 mg via INTRAVENOUS

## 2022-04-17 MED ORDER — FENTANYL CITRATE (PF) 250 MCG/5ML IJ SOLN
INTRAMUSCULAR | Status: AC
  Filled 2022-04-17: qty 5

## 2022-04-17 MED ORDER — PROPOFOL 500 MG/50ML IV EMUL
INTRAVENOUS | Status: DC | PRN
Start: 2022-04-17 — End: 2022-04-17
  Administered 2022-04-17: 100 ug/kg/min via INTRAVENOUS

## 2022-04-17 MED ORDER — HYDROCODONE-ACETAMINOPHEN 5-325 MG PO TABS: 1.0000 | ORAL_TABLET | Freq: Four times a day (QID) | ORAL | 0 refills | Status: DC | PRN

## 2022-04-17 MED ORDER — FENTANYL CITRATE (PF) 250 MCG/5ML IJ SOLN
INTRAMUSCULAR | Status: DC | PRN
  Administered 2022-04-17 (×2): 25 ug via INTRAVENOUS

## 2022-04-17 MED ORDER — CHLORHEXIDINE GLUCONATE 0.12 % MT SOLN
OROMUCOSAL | Status: AC
  Administered 2022-04-17: 15 mL via OROMUCOSAL
  Filled 2022-04-17: qty 15

## 2022-04-17 MED ORDER — MISOPROSTOL 200 MCG PO TABS: 400.0000 ug | ORAL_TABLET | Freq: Three times a day (TID) | ORAL | 0 refills | Status: AC

## 2022-04-17 MED ORDER — METHYLERGONOVINE MALEATE 0.2 MG/ML IJ SOLN
INTRAMUSCULAR | Status: DC | PRN
  Administered 2022-04-17: .2 mg via INTRAMUSCULAR

## 2022-04-17 MED ORDER — ORAL CARE MOUTH RINSE: 15.0000 mL | Freq: Once | OROMUCOSAL | Status: AC

## 2022-04-17 MED ORDER — LIDOCAINE 2% (20 MG/ML) 5 ML SYRINGE
INTRAMUSCULAR | Status: AC
  Filled 2022-04-17: qty 5

## 2022-04-17 MED ORDER — KETOROLAC TROMETHAMINE 30 MG/ML IJ SOLN
INTRAMUSCULAR | Status: DC | PRN
  Administered 2022-04-17: 30 mg via INTRAVENOUS

## 2022-04-17 MED ORDER — ONDANSETRON HCL 4 MG/2ML IJ SOLN
INTRAMUSCULAR | Status: DC | PRN
  Administered 2022-04-17: 4 mg via INTRAVENOUS

## 2022-04-17 MED ORDER — BUPIVACAINE HCL (PF) 0.5 % IJ SOLN
INTRAMUSCULAR | Status: AC
  Filled 2022-04-17: qty 30

## 2022-04-17 MED ORDER — LACTATED RINGERS IV SOLN: INTRAVENOUS | Status: DC

## 2022-04-17 MED ORDER — FENTANYL CITRATE (PF) 100 MCG/2ML IJ SOLN: 25.0000 ug | INTRAMUSCULAR | Status: DC | PRN

## 2022-04-17 SURGICAL SUPPLY — 23 items
CATH ROBINSON RED A/P 16FR (CATHETERS) ×2 IMPLANT
DECANTER SPIKE VIAL GLASS SM (MISCELLANEOUS) ×2 IMPLANT
FILTER UTR ASPR ASSEMBLY (MISCELLANEOUS) ×2 IMPLANT
GLOVE BIOGEL PI IND STRL 7.0 (GLOVE) ×1 IMPLANT
GLOVE BIOGEL PI IND STRL 8 (GLOVE) ×1 IMPLANT
GLOVE BIOGEL PI INDICATOR 7.0 (GLOVE) ×1
GLOVE BIOGEL PI INDICATOR 8 (GLOVE) ×1
GLOVE ECLIPSE 8.0 STRL XLNG CF (GLOVE) ×2 IMPLANT
GOWN STRL REUS W/ TWL LRG LVL3 (GOWN DISPOSABLE) ×2 IMPLANT
GOWN STRL REUS W/TWL LRG LVL3 (GOWN DISPOSABLE) ×4
KIT BERKELEY 1ST TRI 3/8 NO TR (MISCELLANEOUS) ×2 IMPLANT
KIT BERKELEY 1ST TRIMESTER 3/8 (MISCELLANEOUS) ×2 IMPLANT
NS IRRIG 1000ML POUR BTL (IV SOLUTION) ×2 IMPLANT
PACK VAGINAL MINOR WOMEN LF (CUSTOM PROCEDURE TRAY) ×2 IMPLANT
PAD OB MATERNITY 4.3X12.25 (PERSONAL CARE ITEMS) ×2 IMPLANT
SET BERKELEY SUCTION TUBING (SUCTIONS) ×2 IMPLANT
TOWEL GREEN STERILE FF (TOWEL DISPOSABLE) ×4 IMPLANT
UNDERPAD 30X36 HEAVY ABSORB (UNDERPADS AND DIAPERS) ×2 IMPLANT
VACURETTE 10 RIGID CVD (CANNULA) IMPLANT
VACURETTE 7MM CVD STRL WRAP (CANNULA) IMPLANT
VACURETTE 8 RIGID CVD (CANNULA) IMPLANT
VACURETTE 8MM F TIP (MISCELLANEOUS) ×1 IMPLANT
VACURETTE 9 RIGID CVD (CANNULA) IMPLANT

## 2022-04-17 NOTE — Progress Notes (Signed)
Patient ID: Dorothy Lopez, female   DOB: 10-02-1981, 41 y.o.   MRN: [id number] ? ? ?Assessment/Plan: ?Principal Problem: ?  Retained products of conception after induced termination of pregnancy ?Acute blood loss anemia ? ?S/p 2 u PRBCs ?Repeat Hgb ?Still having some bleeding 125 cc overnight ?Will see what her bleeding and hgb are doing--re-discussed D & E. ? ? ?Subjective: ?Interval History:s/p cytotec overnight ? ?Objective: ?Vital signs in last 24 hours: ?Temp:  [98.1 ?F (36.7 ?C)-99.1 ?F (37.3 ?C)] 99.1 ?F (37.3 ?C) (04/30 0710) ?Pulse Rate:  [78-125] 84 (04/30 0710) ?Resp:  [11-29] 18 (04/30 0710) ?BP: (90-154)/(32-79) 90/45 (04/30 0710) ?SpO2:  [96 %-100 %] 98 % (04/30 0710) ?Weight:  [69.4 kg-69.9 kg] 69.9 kg (04/30 0515) ? ?Intake/Output from previous day: ?04/29 0701 - 04/30 0700 ?In: 460  ?Out: 375.5 [Urine:250] ?Intake/Output this shift: ?No intake/output data recorded. ? ?General appearance: alert, cooperative, and appears stated age ?Head: Normocephalic, without obvious abnormality, atraumatic ?Neck: supple, symmetrical, trachea midline ?Lungs: normal effort ?Heart: regular rate and rhythm ?Abdomen: soft, non-tender; bowel sounds normal; no masses,  no organomegaly ?Extremities: extremities normal, atraumatic, no cyanosis or edema ? ?Results for orders placed or performed during the hospital encounter of 04/16/22 (from the past 24 hour(s))  ?CBC with Differential     Status: Abnormal  ? Collection Time: 04/16/22  4:22 PM  ?Result Value Ref Range  ? WBC 12.5 (H) 4.0 - 10.5 K/uL  ? RBC 2.42 (L) 3.87 - 5.11 MIL/uL  ? Hemoglobin 5.9 (LL) 12.0 - 15.0 g/dL  ? HCT 19.0 (L) 36.0 - 46.0 %  ? MCV 78.5 (L) 80.0 - 100.0 fL  ? MCH 24.4 (L) 26.0 - 34.0 pg  ? MCHC 31.1 30.0 - 36.0 g/dL  ? RDW 16.0 (H) 11.5 - 15.5 %  ? Platelets 357 150 - 400 K/uL  ? nRBC 0.0 0.0 - 0.2 %  ? Neutrophils Relative % 82 %  ? Neutro Abs 10.3 (H) 1.7 - 7.7 K/uL  ? Lymphocytes Relative 11 %  ? Lymphs Abs 1.4 0.7 - 4.0 K/uL  ? Monocytes  Relative 4 %  ? Monocytes Absolute 0.5 0.1 - 1.0 K/uL  ? Eosinophils Relative 2 %  ? Eosinophils Absolute 0.2 0.0 - 0.5 K/uL  ? Basophils Relative 1 %  ? Basophils Absolute 0.1 0.0 - 0.1 K/uL  ? Immature Granulocytes 0 %  ? Abs Immature Granulocytes 0.05 0.00 - 0.07 K/uL  ?Comprehensive metabolic panel     Status: Abnormal  ? Collection Time: 04/16/22  4:22 PM  ?Result Value Ref Range  ? Sodium 135 135 - 145 mmol/L  ? Potassium 3.8 3.5 - 5.1 mmol/L  ? Chloride 106 98 - 111 mmol/L  ? CO2 23 22 - 32 mmol/L  ? Glucose, Bld 95 70 - 99 mg/dL  ? BUN 11 6 - 20 mg/dL  ? Creatinine, Ser 0.74 0.44 - 1.00 mg/dL  ? Calcium 9.0 8.9 - 10.3 mg/dL  ? Total Protein 7.7 6.5 - 8.1 g/dL  ? Albumin 3.4 (L) 3.5 - 5.0 g/dL  ? AST 15 15 - 41 U/L  ? ALT 14 0 - 44 U/L  ? Alkaline Phosphatase 47 38 - 126 U/L  ? Total Bilirubin 0.5 0.3 - 1.2 mg/dL  ? GFR, Estimated >60 >60 mL/min  ? Anion gap 6 5 - 15  ?hCG, quantitative, pregnancy     Status: Abnormal  ? Collection Time: 04/16/22  4:22 PM  ?Result Value Ref Range  ?  hCG, Beta Chain, Quant, S 5,812 (H) <5 mIU/mL  ?Type and screen [facility]     Status: None  ? Collection Time: 04/16/22  4:22 PM  ?Result Value Ref Range  ? ABO/RH(D) O POS   ? Antibody Screen NEG   ? Sample Expiration    ?  04/19/2022,2359 ?Performed at [facility], [address] [place], [place] [address] ?  ?Prepare RBC (crossmatch)     Status: None  ? Collection Time: 04/16/22  8:02 PM  ?Result Value Ref Range  ? Order Confirmation    ?  ORDER PROCESSED BY BLOOD BANK ?Performed at [facility], [address] [place], [place] [address] ?  ?Type and screen [facility]     Status: None (Preliminary result)  ? Collection Time: 04/16/22  9:02 PM  ?Result Value Ref Range  ? ABO/RH(D) O POS   ? Antibody Screen NEG   ? Sample Expiration 04/19/2022,2359   ? Unit Number [id number]   ? Blood Component Type RED CELLS,LR   ? Unit division 00   ? Status of Unit ISSUED    ? Transfusion Status OK TO TRANSFUSE   ? Crossmatch Result    ?  Compatible ?Performed at [facility], [address] [place], [place] [address] ?  ? Unit Number [id number]   ? Blood Component Type RED CELLS,LR   ? Unit division 00   ? Status of Unit ISSUED   ? Transfusion Status OK TO TRANSFUSE   ? Crossmatch Result Compatible   ? ? ?Studies/Results: ?US OB Comp Less 14 Wks ? ?Result Date: 04/16/2022 ?CLINICAL DATA:  Heavy vaginal bleeding and dizziness today. Technologist notes state there pubic medication abortion on 04/10. beta HCG 5,812 EXAM: OBSTETRIC <14 WK US AND TRANSVAGINAL OB US TECHNIQUE: Both transabdominal and transvaginal ultrasound examinations were performed for complete evaluation of the gestation as well as the maternal uterus, adnexal regions, and pelvic cul-de-sac. Transvaginal technique was performed to assess early pregnancy. COMPARISON:  None. FINDINGS: Intrauterine gestational sac: None Yolk sac:  Not Visualized. Embryo:  Not Visualized. Maternal uterus/adnexae: The uterus is anteverted on transabdominal imaging but appears retroverted on transvaginal scanning. There is no intrauterine gestational sac. The endometrium measures 20 mm, heterogeneous with increased vascularity. Shadowing foci in the endometrium may represent air in the endometrial canal. Both ovaries are visualized, there is a physiologic corpus luteal cyst on the right. No adnexal mass. No pelvic free fluid. IMPRESSION: 1. No intrauterine gestation or findings of acute optic pregnancy. 2. Thickened heterogeneous endometrium with vascularity, consistent with retained products of conception in the appropriate clinical setting. There are linear shadowing foci in the endometrial canal it may represent air. Recommend clinical correlation for endometritis. 3. Normal sonographic appearance of the ovaries.  No adnexal mass. Electronically Signed   By: Narda RutherfordMelanie  Sanford M.D.   On: 04/16/2022 21:12  ? ?US OB  Transvaginal ? ?Result Date: 04/16/2022 ?CLINICAL DATA:  Heavy vaginal bleeding and dizziness today. Technologist notes state there pubic medication abortion on 04/10. beta HCG 5,812 EXAM: OBSTETRIC <14 WK US AND TRANSVAGINAL OB US TECHNIQUE: Both transabdominal and transvaginal ultrasound examinations were performed for complete evaluation of the gestation as well as the maternal uterus, adnexal regions, and pelvic cul-de-sac. Transvaginal technique was performed to assess early pregnancy. COMPARISON:  None. FINDINGS: Intrauterine gestational sac: None Yolk sac:  Not Visualized. Embryo:  Not Visualized. Maternal uterus/adnexae: The uterus is anteverted on transabdominal imaging  but appears retroverted on transvaginal scanning. There is no intrauterine gestational sac. The endometrium measures 20 mm, heterogeneous with increased vascularity. Shadowing foci in the endometrium may represent air in the endometrial canal. Both ovaries are visualized, there is a physiologic corpus luteal cyst on the right. No adnexal mass. No pelvic free fluid. IMPRESSION: 1. No intrauterine gestation or findings of acute optic pregnancy. 2. Thickened heterogeneous endometrium with vascularity, consistent with retained products of conception in the appropriate clinical setting. There are linear shadowing foci in the endometrial canal it may represent air. Recommend clinical correlation for endometritis. 3. Normal sonographic appearance of the ovaries.  No adnexal mass. Electronically Signed   By: Narda Rutherford M.D.   On: 04/16/2022 21:12   ? ?Scheduled Meds: ? tranexamic acid  1,300 mg Oral TID  ? ?Continuous Infusions: ?PRN Meds:alum & mag hydroxide-simeth, guaiFENesin, HYDROcodone-acetaminophen, ibuprofen, menthol-cetylpyridinium, metoCLOPramide, polyethylene glycol ? ? ? LOS: 0 days  ? ?Reva Bores, MD ?04/17/2022 ?7:59 AM ? ?

## 2022-04-17 NOTE — Progress Notes (Signed)
OB Note ? ? ?  Latest Ref Rng & Units 04/17/2022  ?  8:28 AM 04/16/2022  ?  4:22 PM 07/29/2016  ?  2:55 PM  ?CBC  ?WBC 4.0 - 10.5 K/uL 7.7   12.5   6.9    ?Hemoglobin 12.0 - 15.0 g/dL 8.7   5.9   9.8    ?Hematocrit 36.0 - 46.0 % 27.4   19.0   30.2    ?Platelets 150 - 400 K/uL 297   357   273    ? ?Patient states bleeding slowed but still having some. D/w her re: good rise s/p 2U PRBCs but needs u/s to see if still has retained POCs s/p cytotec 1034mcg at midnight. I told her if she still has e/o retained POCs then surgery via d&c is needed, which was recommended overnight but patient declined. Patient still unsure if she wants this and couldn't verbalize a reason as to why when I asked her. I went over the procedure and post op care with her.  Patient had Jello at 0900 and I asked her to remain NPO until u/s results are back ? ?Durene Romans MD ?Attending ?[facility] [employer] [facility]) ?04/17/2022 ?Time: 1050 ? ?

## 2022-04-17 NOTE — Anesthesia Procedure Notes (Signed)
Procedure Name: [place] ?Date/Time: 04/17/2022 4:20 PM ?Performed by: Trinna Post., CRNA ?Pre-anesthesia Checklist: Patient identified, Emergency Drugs available, Suction available, Patient being monitored and Timeout performed ?Patient Re-evaluated:Patient Re-evaluated prior to induction ?Oxygen Delivery Method: Simple face mask ?Preoxygenation: Pre-oxygenation with 100% oxygen ?Induction Type: IV induction ?Placement Confirmation: positive ETCO2 ? ? ? ? ?

## 2022-04-17 NOTE — Anesthesia Preprocedure Evaluation (Addendum)
Anesthesia Evaluation  ?Patient identified by MRN, date of birth, ID band ?Patient awake ? ? ? ?Reviewed: ?Allergy & Precautions, NPO status , Patient's Chart, lab work & pertinent test results ? ?Airway ?Mallampati: II ? ?TM Distance: >3 FB ?Neck ROM: Full ? ? ? Dental ? ?(+) Dental Advisory Given ?  ?Pulmonary ?neg pulmonary ROS,  ?  ?breath sounds clear to auscultation ? ? ? ? ? ? Cardiovascular ?negative cardio ROS ? ? ?Rhythm:Regular Rate:Normal ? ? ?  ?Neuro/Psych ?negative neurological ROS ?   ? GI/Hepatic ?negative GI ROS, Neg liver ROS,   ?Endo/Other  ?negative endocrine ROS ? Renal/GU ?negative Renal ROS  ? ?  ?Musculoskeletal ? ? Abdominal ?  ?Peds ? Hematology ?negative hematology ROS ?(+)   ?Anesthesia Other Findings ? ? Reproductive/Obstetrics ? ?  ? ? ? ? ? ? ? ? ? ? ? ? ? ?  ?  ? ? ? ? ? ? ? ? ?Anesthesia Physical ?Anesthesia Plan ? ?ASA: 2 ? ?Anesthesia Plan: MAC  ? ?Post-op Pain Management: Minimal or no pain anticipated  ? ?Induction:  ? ?PONV Risk Score and Plan: 2 and Propofol infusion, Ondansetron and Treatment may vary due to age or medical condition ? ?Airway Management Planned: Natural Airway and Simple Face Mask ? ?Additional Equipment:  ? ?Intra-op Plan:  ? ?Post-operative Plan:  ? ?Informed Consent: I have reviewed the patients History and Physical, chart, labs and discussed the procedure including the risks, benefits and alternatives for the proposed anesthesia with the patient or authorized representative who has indicated his/her understanding and acceptance.  ? ? ? ? ? ?Plan Discussed with: CRNA ? ?Anesthesia Plan Comments:   ? ? ? ? ? ?Anesthesia Quick Evaluation ? ?

## 2022-04-17 NOTE — Plan of Care (Signed)

## 2022-04-17 NOTE — Anesthesia Postprocedure Evaluation (Signed)
Anesthesia Post Note ? ?Patient: Dorothy Lopez ? ?Procedure(s) Performed: DILATATION AND EVACUATION (Vagina ) ? ?  ? ?Patient location during evaluation: PACU ?Anesthesia Type: MAC ?Level of consciousness: awake and alert ?Pain management: pain level controlled ?Vital Signs Assessment: post-procedure vital signs reviewed and stable ?Respiratory status: spontaneous breathing, nonlabored ventilation, respiratory function stable and patient connected to nasal cannula oxygen ?Cardiovascular status: stable and blood pressure returned to baseline ?Postop Assessment: no apparent nausea or vomiting ?Anesthetic complications: no ? ? ?No notable events documented. ? ?Last Vitals:  ?Vitals:  ? 04/17/22 1715 04/17/22 1730  ?BP: 103/70 110/73  ?Pulse: 85 75  ?Resp: 17 18  ?Temp:  (!) 36.3 ?C  ?SpO2: 100% 100%  ?  ?Last Pain:  ?Vitals:  ? 04/17/22 1730  ?TempSrc:   ?PainSc: 0-No pain  ? ? ?  ?  ?  ?  ?  ?  ? ?Suzette Battiest E ? ? ? ? ?

## 2022-04-17 NOTE — Op Note (Incomplete)
Preoperative diagnosis:  *** ? ?Blighted ovum in the first trimester, with unsuccessful attempts at outpatient management and ? ?Postoperative diagnosis:  Same as above ? ?Procedure:  Cervical dilation with suction and sharp uterine curettage ? ?Surgeon:  Lazaro Arms ? ?Anesthesia:  Laryngeal mask airway ? ?Findings:  The patient was known to have a first trimester pregnancy loss by serial quantitative hCG and serial sonogram evaluations from the office.  She had actually undergone Cytotec vaginally and Cytotec orally and subsequently sonogram evaluations and was found to not have passed a pregnancy at all.   As a result she is admitted for a uterine  Evacuation. ? ?Description of operation:  The patient was taken to the operating room and placed in the supine position.  She underwent laryngeal mask airway general anesthesia.  The patient was placed in the dorsal lithotomy position.  The vagina was prepped and draped in the usual sterile fashion.  A Graves speculum was placed.  The anterior cervix was grasped with a single-tooth tenaculum.  The cervix was dilated serially with Hegar dilators.  A #8 curved suction curette was placed in the uterus.  The suction pressure was placed at 55 and several passes were made.  All of the intrauterine contents were removed.  The sharp curette was used x1 to feel uterine crie in all areas.  The patient was given Methergine 0.2 mg IV x1.  There was good hemostasis.  The patient was given Cleocin 900 mg IV and ciprofloxacin 400 mg IV preoperatively.  The patient was given Toradol 30 mg IV preoperatively.  Estimated blood loss for the procedure was 100 cc.  The patient was awakened from anesthesia taken to the recovery room in good stable condition.  All counts were correct x3. ? ?Lazaro Arms, MD ?04/17/2022 ?5:12 PM ? ?  ?

## 2022-04-17 NOTE — Transfer of Care (Signed)
Immediate Anesthesia Transfer of Care Note ? ?Patient: Dorothy Lopez ? ?Procedure(s) Performed: DILATATION AND EVACUATION (Vagina ) ? ?Patient Location: PACU ? ?Anesthesia Type:MAC ? ?Level of Consciousness: awake, alert  and oriented ? ?Airway & Oxygen Therapy: Patient Spontanous Breathing ? ?Post-op Assessment: Report given to RN and Post -op Vital signs reviewed and stable ? ?Post vital signs: Reviewed and stable ? ?Last Vitals:  ?Vitals Value Taken Time  ?BP 97/63 04/17/22 1701  ?Temp    ?Pulse 80 04/17/22 1704  ?Resp 14 04/17/22 1704  ?SpO2 100 % 04/17/22 1704  ?Vitals shown include unvalidated device data. ? ?Last Pain:  ?Vitals:  ? 04/17/22 1533  ?TempSrc: Oral  ?PainSc:   ?   ? ?  ? ?Complications: No notable events documented. ?

## 2022-04-18 ENCOUNTER — Encounter (HOSPITAL_COMMUNITY): Payer: Self-pay | Admitting: Obstetrics & Gynecology

## 2022-04-18 ENCOUNTER — Other Ambulatory Visit: Payer: Self-pay | Admitting: Obstetrics & Gynecology

## 2022-04-18 DIAGNOSIS — O074 Failed attempted termination of pregnancy without complication: Secondary | ICD-10-CM

## 2022-04-18 LAB — BPAM RBC
Blood Product Expiration Date: 202305302359
Blood Product Expiration Date: 202305302359
ISSUE DATE / TIME: 202304292305
ISSUE DATE / TIME: 202304300250
Unit Type and Rh: 5100
Unit Type and Rh: 5100

## 2022-04-18 LAB — TYPE AND SCREEN
ABO/RH(D): O POS
Antibody Screen: NEGATIVE
Unit division: 0
Unit division: 0

## 2022-04-18 MED ORDER — HYDROCODONE-ACETAMINOPHEN 5-325 MG PO TABS: 1.0000 | ORAL_TABLET | Freq: Four times a day (QID) | ORAL | 0 refills | Status: AC | PRN

## 2022-04-18 NOTE — Progress Notes (Signed)
[facility] placed RX on backorder.  Sent to [facility] instead ?

## 2022-04-19 LAB — SURGICAL PATHOLOGY

## 2022-07-25 NOTE — Op Note (Signed)
Preoperative diagnosis:  Retained POC after medical termination of pregnancy, 03/28/22                                         Heavy vaginal bleeding leading to anemia requiring transfusion  Postoperative diagnosis:  Same as above  Procedure:  Cervical dilation with suction and sharp uterine curettage  Surgeon:  Lazaro Arms  Anesthesia:  Laryngeal mask airway  Findings:  Sonogram reveals definitive retained POC with heavy vaginal bleeding and anemia requiring transfusion  Intraop POC were present and removed  Description of operation:  The patient was taken to the operating room and placed in the supine position.  She underwent laryngeal mask airway general anesthesia.  The patient was placed in the dorsal lithotomy position.  The vagina was prepped and draped in the usual sterile fashion.  A Graves speculum was placed.  The anterior cervix was grasped with a single-tooth tenaculum.  The cervix was dilated serially with Hegar dilators.  A #8 curved suction curette was placed in the uterus.  The suction pressure was placed at 55 and several passes were made.  All of the intrauterine contents were removed.  The sharp curette was used x1 to feel uterine crie in all areas.  The patient was given Methergine 0.2 mg IV x1.  There was good hemostasis.  The patient received ancef .  The patient was given Toradol 30 mg IV preoperatively.  Estimated blood loss for the procedure was minimal cc.  The patient was awakened from anesthesia taken to the recovery room in good stable condition.  All counts were correct x3.  Yitzchok Carriger H

## 2022-07-25 NOTE — Discharge Summary (Signed)
Physician Discharge Summary  Patient ID: Dorothy Lopez MRN: [id number] DOB/AGE: 1981-08-24 41 y.o.  Admit date: 04/16/2022 Discharge date: 04/17/22  Admission Diagnoses: Retained POC Discharge Diagnoses:  Principal Problem:   Retained products of conception after induced termination of pregnancy   Discharged Condition: stable  Hospital Course: pt admitted with retained POC after TOC  Consults: None  Significant Diagnostic Studies: sonogram  Treatments: surgery: D&C  Discharge Exam: Blood pressure 110/73, pulse 75, temperature (!) 97.4 F (36.3 C), resp. rate 18, height 5' 0.98" (1.549 m), weight 69.9 kg, SpO2 100 %. General appearance: alert, cooperative, and no distress GI: soft, non-tender; bowel sounds normal; no masses,  no organomegaly  Disposition: Discharge disposition: 01-Home or Self Care       Discharge Instructions     Call MD for:  persistant nausea and vomiting   Complete by: As directed    Call MD for:  severe uncontrolled pain   Complete by: As directed    Call MD for:  temperature >100.4   Complete by: As directed    Diet - low sodium heart healthy   Complete by: As directed    Increase activity slowly   Complete by: As directed    Sexual Activity Restrictions   Complete by: As directed    No sex for 4 weeks      Allergies as of 04/17/2022   No Known Allergies      Medication List     TAKE these medications    ketorolac 10 MG tablet Commonly known as: TORADOL Take 1 tablet (10 mg total) by mouth every 8 (eight) hours as needed.   misoprostol 200 MCG tablet Commonly known as: Cytotec Take 2 tablets (400 mcg total) by mouth 3 (three) times daily.   ondansetron 8 MG disintegrating tablet Commonly known as: ZOFRAN-ODT Take 1 tablet (8 mg total) by mouth every 8 (eight) hours as needed for nausea or vomiting.        Follow-up Information     [facility] Follow up in 2  week(s).   Specialty: Obstetrics and Gynecology Why: post op visit Contact information: [address] [place] [address] [phone]                Signed: Lazaro Arms 07/25/2022, 1:05 PM
